# Patient Record
Sex: Female | Born: 1980 | Hispanic: No | Marital: Single | Smoking: Former smoker
Health system: Southern US, Community
[De-identification: ages and names within clinical notes are randomized; demographics above are authoritative.]

## PROBLEM LIST (undated history)

## (undated) DIAGNOSIS — F419 Anxiety disorder, unspecified: Secondary | ICD-10-CM

## (undated) DIAGNOSIS — F32A Depression, unspecified: Secondary | ICD-10-CM

## (undated) HISTORY — PX: DIAGNOSTIC LAPAROSCOPY WITH REMOVAL OF ECTOPIC PREGNANCY: SHX6449

## (undated) HISTORY — DX: Anxiety disorder, unspecified: F41.9

## (undated) HISTORY — DX: Depression, unspecified: F32.A

## (undated) HISTORY — PX: ROTATOR CUFF REPAIR: SHX139

---

## 2021-08-20 DIAGNOSIS — Z419 Encounter for procedure for purposes other than remedying health state, unspecified: Secondary | ICD-10-CM | POA: Diagnosis not present

## 2021-09-19 DIAGNOSIS — Z419 Encounter for procedure for purposes other than remedying health state, unspecified: Secondary | ICD-10-CM | POA: Diagnosis not present

## 2021-10-20 DIAGNOSIS — Z419 Encounter for procedure for purposes other than remedying health state, unspecified: Secondary | ICD-10-CM | POA: Diagnosis not present

## 2021-11-18 DIAGNOSIS — Z01419 Encounter for gynecological examination (general) (routine) without abnormal findings: Secondary | ICD-10-CM | POA: Diagnosis not present

## 2021-11-18 DIAGNOSIS — Z3046 Encounter for surveillance of implantable subdermal contraceptive: Secondary | ICD-10-CM | POA: Diagnosis not present

## 2021-11-18 DIAGNOSIS — E663 Overweight: Secondary | ICD-10-CM | POA: Diagnosis not present

## 2021-11-20 DIAGNOSIS — Z419 Encounter for procedure for purposes other than remedying health state, unspecified: Secondary | ICD-10-CM | POA: Diagnosis not present

## 2021-12-09 ENCOUNTER — Other Ambulatory Visit: Payer: Self-pay | Admitting: Obstetrics & Gynecology

## 2021-12-09 DIAGNOSIS — Z1231 Encounter for screening mammogram for malignant neoplasm of breast: Secondary | ICD-10-CM

## 2021-12-18 DIAGNOSIS — Z419 Encounter for procedure for purposes other than remedying health state, unspecified: Secondary | ICD-10-CM | POA: Diagnosis not present

## 2021-12-20 ENCOUNTER — Ambulatory Visit
Admission: RE | Admit: 2021-12-20 | Discharge: 2021-12-20 | Disposition: A | Discharge status: Home / Self Care | Payer: Medicaid Other | Source: Ambulatory Visit | Attending: Obstetrics & Gynecology | Admitting: Obstetrics & Gynecology

## 2021-12-20 DIAGNOSIS — Z1231 Encounter for screening mammogram for malignant neoplasm of breast: Secondary | ICD-10-CM | POA: Diagnosis not present

## 2022-01-18 DIAGNOSIS — Z419 Encounter for procedure for purposes other than remedying health state, unspecified: Secondary | ICD-10-CM | POA: Diagnosis not present

## 2022-02-17 DIAGNOSIS — Z419 Encounter for procedure for purposes other than remedying health state, unspecified: Secondary | ICD-10-CM | POA: Diagnosis not present

## 2022-02-19 ENCOUNTER — Ambulatory Visit: Payer: Self-pay | Admitting: Nurse Practitioner

## 2022-03-20 DIAGNOSIS — Z419 Encounter for procedure for purposes other than remedying health state, unspecified: Secondary | ICD-10-CM | POA: Diagnosis not present

## 2022-04-19 DIAGNOSIS — Z419 Encounter for procedure for purposes other than remedying health state, unspecified: Secondary | ICD-10-CM | POA: Diagnosis not present

## 2022-05-20 DIAGNOSIS — Z419 Encounter for procedure for purposes other than remedying health state, unspecified: Secondary | ICD-10-CM | POA: Diagnosis not present

## 2022-06-11 ENCOUNTER — Encounter: Payer: Self-pay | Admitting: Nurse Practitioner

## 2022-06-11 ENCOUNTER — Ambulatory Visit: Payer: Medicaid Other | Attending: Nurse Practitioner | Admitting: Nurse Practitioner

## 2022-06-11 VITALS — BP 125/78 | HR 68 | Temp 97.9°F | Ht 63.0 in | Wt 170.4 lb

## 2022-06-11 DIAGNOSIS — F419 Anxiety disorder, unspecified: Secondary | ICD-10-CM

## 2022-06-11 DIAGNOSIS — R590 Localized enlarged lymph nodes: Secondary | ICD-10-CM

## 2022-06-11 DIAGNOSIS — Z7689 Persons encountering health services in other specified circumstances: Secondary | ICD-10-CM | POA: Diagnosis not present

## 2022-06-11 DIAGNOSIS — F32A Depression, unspecified: Secondary | ICD-10-CM

## 2022-06-11 NOTE — Progress Notes (Deleted)
Lump on right side of neck and discuss referral to therapist.

## 2022-06-11 NOTE — Progress Notes (Signed)
Assessment & Plan:  Tami Howard was seen today for new patient (initial visit).  Diagnoses and all orders for this visit:  Encounter to establish care  Lymphadenopathy, cervical -     US Soft Tissue Head/Neck (NON-THYROID); Future  Anxiety and depression -     Ambulatory referral to Integrated Behavioral Health    Patient has been counseled on age-appropriate routine health concerns for screening and prevention. These are reviewed and up-to-date. Referrals have been placed accordingly. Immunizations are up-to-date or declined.    Subjective:   Chief Complaint  Patient presents with   New Patient (Initial Visit)        HPI Tami Howard 41 y.o. female presents to office today  To establish care. She moved to [place] about a year ago from [place]. No significant PMH.   Patient has been counseled on age-appropriate routine health concerns for screening and prevention. These are reviewed and up-to-date. Referrals have been placed accordingly. Immunizations are up-to-date or declined.     MAMMOGRAM: UTD 12-20-2021 PAP: UTD 12-20-2021  She has left posterior cervical lymphadenopathy. Non tender to touch. Has been present for several months now. She also had a dental procedure a few months ago but unsure if related.    Anxiety and Depression She was seeing a therapist in [place] for 3 months prior to moving to [place]. Would like to be referred to therapist to continue therapy. Interested in psychotherapy only at this time.      Review of Systems  Constitutional:  Negative for fever, malaise/fatigue and weight loss.  HENT: Negative.  Negative for nosebleeds.   Eyes: Negative.  Negative for blurred vision, double vision and photophobia.  Respiratory: Negative.  Negative for cough and shortness of breath.   Cardiovascular: Negative.  Negative for chest pain, palpitations and leg swelling.  Gastrointestinal: Negative.  Negative for heartburn, nausea and vomiting.  Musculoskeletal: Negative.  Negative  for myalgias.  Neurological: Negative.  Negative for dizziness, focal weakness, seizures and headaches.  Psychiatric/Behavioral: Negative.  Negative for suicidal ideas.     History reviewed. No pertinent past medical history.  History reviewed. No pertinent surgical history.  Family History  Problem Relation Age of Onset   Breast cancer Neg Hx     Social History Reviewed with no changes to be made today.   No outpatient medications prior to visit.   No facility-administered medications prior to visit.    No Known Allergies     Objective:    BP 125/78   Pulse 68   Temp 97.9 F (36.6 C) (Oral)   Ht 5\' 3"  (1.6 m)   Wt 147 lb (66.7 kg)   LMP 05/06/2022 (Exact Date)   SpO2 98%   BMI 26.04 kg/m  Wt Readings from Last 3 Encounters:  06/11/22 147 lb (66.7 kg)    Physical Exam Vitals and nursing note reviewed.  Constitutional:      Appearance: She is well-developed.  HENT:     Head: Normocephalic and atraumatic.  Cardiovascular:     Rate and Rhythm: Normal rate and regular rhythm.     Heart sounds: Normal heart sounds. No murmur heard.    No friction rub. No gallop.  Pulmonary:     Effort: Pulmonary effort is normal. No tachypnea or respiratory distress.     Breath sounds: Normal breath sounds. No decreased breath sounds, wheezing, rhonchi or rales.  Chest:     Chest wall: No tenderness.  Abdominal:     General: Bowel sounds are  normal.     Palpations: Abdomen is soft.  Musculoskeletal:        General: Normal range of motion.     Cervical back: Normal range of motion.  Skin:    General: Skin is warm and dry.  Neurological:     Mental Status: She is alert and oriented to person, place, and time.     Coordination: Coordination normal.  Psychiatric:        Behavior: Behavior normal. Behavior is cooperative.        Thought Content: Thought content normal.        Judgment: Judgment normal.          Patient has been counseled extensively about nutrition  and exercise as well as the importance of adherence with medications and regular follow-up. The patient was given clear instructions to go to ER or return to medical center if symptoms don't improve, worsen or new problems develop. The patient verbalized understanding.   Follow-up: Return for schedule physical in october or november.   Claiborne Rigg, FNP-BC [facility] [place], [place] [phone]   06/11/2022, 2:31 PM

## 2022-06-13 ENCOUNTER — Ambulatory Visit
Admission: RE | Admit: 2022-06-13 | Discharge: 2022-06-13 | Disposition: A | Discharge status: Home / Self Care | Payer: Medicaid Other | Source: Ambulatory Visit | Attending: Nurse Practitioner | Admitting: Nurse Practitioner

## 2022-06-13 DIAGNOSIS — R590 Localized enlarged lymph nodes: Secondary | ICD-10-CM

## 2022-06-13 DIAGNOSIS — R221 Localized swelling, mass and lump, neck: Secondary | ICD-10-CM | POA: Diagnosis not present

## 2022-06-20 DIAGNOSIS — Z419 Encounter for procedure for purposes other than remedying health state, unspecified: Secondary | ICD-10-CM | POA: Diagnosis not present

## 2022-07-20 DIAGNOSIS — Z419 Encounter for procedure for purposes other than remedying health state, unspecified: Secondary | ICD-10-CM | POA: Diagnosis not present

## 2022-08-20 DIAGNOSIS — Z419 Encounter for procedure for purposes other than remedying health state, unspecified: Secondary | ICD-10-CM | POA: Diagnosis not present

## 2022-08-25 ENCOUNTER — Ambulatory Visit: Payer: Medicaid Other | Admitting: Nurse Practitioner

## 2022-08-27 ENCOUNTER — Encounter: Payer: Self-pay | Admitting: Nurse Practitioner

## 2022-08-27 ENCOUNTER — Ambulatory Visit: Payer: Medicaid Other | Attending: Nurse Practitioner | Admitting: Nurse Practitioner

## 2022-08-27 VITALS — BP 111/72 | HR 90 | Temp 98.0°F | Ht 63.0 in | Wt 171.4 lb

## 2022-08-27 DIAGNOSIS — Z0000 Encounter for general adult medical examination without abnormal findings: Secondary | ICD-10-CM | POA: Diagnosis not present

## 2022-08-27 DIAGNOSIS — Z1159 Encounter for screening for other viral diseases: Secondary | ICD-10-CM | POA: Diagnosis not present

## 2022-08-27 NOTE — Progress Notes (Signed)
Assessment & Plan:  Tami Howard was seen today for annual exam.  Diagnoses and all orders for this visit:  Annual physical exam -     CMP14+EGFR -     CBC with Differential -     Lipid panel -     Thyroid Panel With TSH -     HCV Ab w Reflex to Quant PCR  Need for hepatitis C screening test -     HCV Ab w Reflex to Quant PCR    Patient has been counseled on age-appropriate routine health concerns for screening and prevention. These are reviewed and up-to-date. Referrals have been placed accordingly. Immunizations are up-to-date or declined.    Subjective:   Chief Complaint  Patient presents with   Annual Exam   HPI Tami Howard 41 y.o. female presents to office today for annual physical.  She has no questions or concerns today. Mother is not doing well. Recently diagnosed with cancer. She wants to know about hormone testing.     Review of Systems  Constitutional:  Negative for fever, malaise/fatigue and weight loss.  HENT: Negative.  Negative for nosebleeds.   Eyes: Negative.  Negative for blurred vision, double vision and photophobia.  Respiratory: Negative.  Negative for cough and shortness of breath.   Cardiovascular: Negative.  Negative for chest pain, palpitations and leg swelling.  Gastrointestinal: Negative.  Negative for heartburn, nausea and vomiting.  Genitourinary: Negative.   Musculoskeletal: Negative.  Negative for myalgias.  Skin: Negative.   Neurological: Negative.  Negative for dizziness, focal weakness, seizures and headaches.  Endo/Heme/Allergies: Negative.   Psychiatric/Behavioral: Negative.  Negative for suicidal ideas.     History reviewed. No pertinent past medical history.  History reviewed. No pertinent surgical history.  Family History  Problem Relation Age of Onset   Breast cancer Neg Hx     Social History Reviewed with no changes to be made today.   No outpatient medications prior to visit.   No facility-administered medications  prior to visit.    No Known Allergies     Objective:    BP 111/72   Pulse 90   Temp 98 F (36.7 C) (Temporal)   Ht _0  (1.6 m)   Wt 171 lb 6.4 oz (77.7 kg)   LMP 07/14/2022 (Approximate)   SpO2 99%   BMI 30.36 kg/m  Wt Readings from Last 3 Encounters:  08/27/22 171 lb 6.4 oz (77.7 kg)  06/11/22 170 lb 6.4 oz (77.3 kg)    Physical Exam Constitutional:      Appearance: She is well-developed.  HENT:     Head: Normocephalic and atraumatic.     Right Ear: Hearing, tympanic membrane, ear canal and external ear normal.     Left Ear: Hearing, tympanic membrane, ear canal and external ear normal.     Nose: Nose normal.     Right Turbinates: Not enlarged.     Left Turbinates: Not enlarged.     Mouth/Throat:     Lips: Pink.     Mouth: Mucous membranes are moist.     Dentition: No dental tenderness, gingival swelling, dental abscesses or gum lesions.     Pharynx: No oropharyngeal exudate.  Eyes:     General: No scleral icterus.       Right eye: No discharge.     Extraocular Movements: Extraocular movements intact.     Conjunctiva/sclera: Conjunctivae normal.     Pupils: Pupils are equal, round, and reactive to light.  Neck:  Thyroid: No thyromegaly.     Trachea: No tracheal deviation.  Cardiovascular:     Rate and Rhythm: Normal rate and regular rhythm.     Heart sounds: Normal heart sounds. No murmur heard.    No friction rub.  Pulmonary:     Effort: Pulmonary effort is normal. No accessory muscle usage or respiratory distress.     Breath sounds: Normal breath sounds. No decreased breath sounds, wheezing, rhonchi or rales.  Abdominal:     General: Bowel sounds are normal. There is no distension.     Palpations: Abdomen is soft. There is no mass.     Tenderness: There is no abdominal tenderness. There is no right CVA tenderness, left CVA tenderness, guarding or rebound.     Hernia: No hernia is present.  Musculoskeletal:        General: No tenderness or deformity.  Normal range of motion.     Cervical back: Normal range of motion and neck supple.  Lymphadenopathy:     Cervical: No cervical adenopathy.  Skin:    General: Skin is warm and dry.     Findings: No erythema.  Neurological:     Mental Status: She is alert and oriented to person, place, and time.     Cranial Nerves: No cranial nerve deficit.     Motor: Motor function is intact.     Coordination: Coordination is intact. Coordination normal.     Gait: Gait is intact.     Deep Tendon Reflexes:     Reflex Scores:      Patellar reflexes are 1+ on the right side and 1+ on the left side. Psychiatric:        Attention and Perception: Attention normal.        Mood and Affect: Mood normal.        Speech: Speech normal.        Behavior: Behavior normal.        Thought Content: Thought content normal.        Judgment: Judgment normal.          Patient has been counseled extensively about nutrition and exercise as well as the importance of adherence with medications and regular follow-up. The patient was given clear instructions to go to ER or return to medical center if symptoms don't improve, worsen or new problems develop. The patient verbalized understanding.   Follow-up: Return in about 6 months (around 02/25/2023).   Gildardo Pounds, FNP-BC [facility] and [place], [place]   08/27/2022, 3:28 PM

## 2022-08-28 LAB — CBC WITH DIFFERENTIAL/PLATELET
Basophils Absolute: 0.1 10*3/uL (ref 0.0–0.2)
Basos: 1 %
EOS (ABSOLUTE): 0.1 10*3/uL (ref 0.0–0.4)
Eos: 1 %
Hematocrit: 40.3 % (ref 34.0–46.6)
Hemoglobin: 13.6 g/dL (ref 11.1–15.9)
Immature Grans (Abs): 0 10*3/uL (ref 0.0–0.1)
Immature Granulocytes: 0 %
Lymphocytes Absolute: 3.5 10*3/uL — ABNORMAL HIGH (ref 0.7–3.1)
Lymphs: 40 %
MCH: 30.6 pg (ref 26.6–33.0)
MCHC: 33.7 g/dL (ref 31.5–35.7)
MCV: 91 fL (ref 79–97)
Monocytes Absolute: 0.7 10*3/uL (ref 0.1–0.9)
Monocytes: 8 %
Neutrophils Absolute: 4.4 10*3/uL (ref 1.4–7.0)
Neutrophils: 50 %
Platelets: 180 10*3/uL (ref 150–450)
RBC: 4.44 x10E6/uL (ref 3.77–5.28)
RDW: 12.8 % (ref 11.7–15.4)
WBC: 8.7 10*3/uL (ref 3.4–10.8)

## 2022-08-28 LAB — CMP14+EGFR
ALT: 14 IU/L (ref 0–32)
AST: 16 IU/L (ref 0–40)
Albumin/Globulin Ratio: 1.8 (ref 1.2–2.2)
Albumin: 4.5 g/dL (ref 3.9–4.9)
Alkaline Phosphatase: 48 IU/L (ref 44–121)
BUN/Creatinine Ratio: 8 — ABNORMAL LOW (ref 9–23)
BUN: 6 mg/dL (ref 6–24)
Bilirubin Total: 0.2 mg/dL (ref 0.0–1.2)
CO2: 24 mmol/L (ref 20–29)
Calcium: 10 mg/dL (ref 8.7–10.2)
Chloride: 103 mmol/L (ref 96–106)
Creatinine, Ser: 0.75 mg/dL (ref 0.57–1.00)
Globulin, Total: 2.5 g/dL (ref 1.5–4.5)
Glucose: 93 mg/dL (ref 70–99)
Potassium: 4.6 mmol/L (ref 3.5–5.2)
Sodium: 140 mmol/L (ref 134–144)
Total Protein: 7 g/dL (ref 6.0–8.5)
eGFR: 102 mL/min/{1.73_m2} (ref >59)

## 2022-08-28 LAB — LIPID PANEL
Chol/HDL Ratio: 2 ratio (ref 0.0–4.4)
Cholesterol, Total: 115 mg/dL (ref 100–199)
HDL: 58 mg/dL (ref >39)
LDL Chol Calc (NIH): 33 mg/dL (ref 0–99)
Triglycerides: 140 mg/dL (ref 0–149)
VLDL Cholesterol Cal: 24 mg/dL (ref 5–40)

## 2022-08-28 LAB — THYROID PANEL WITH TSH
Free Thyroxine Index: 1.6 (ref 1.2–4.9)
T3 Uptake Ratio: 19 % — ABNORMAL LOW (ref 24–39)
T4, Total: 8.4 ug/dL (ref 4.5–12.0)
TSH: 1.48 u[IU]/mL (ref 0.450–4.500)

## 2022-08-28 LAB — HCV AB W REFLEX TO QUANT PCR: HCV Ab: NONREACTIVE

## 2022-08-28 LAB — HCV INTERPRETATION

## 2022-09-19 DIAGNOSIS — Z419 Encounter for procedure for purposes other than remedying health state, unspecified: Secondary | ICD-10-CM | POA: Diagnosis not present

## 2022-10-20 DIAGNOSIS — Z419 Encounter for procedure for purposes other than remedying health state, unspecified: Secondary | ICD-10-CM | POA: Diagnosis not present

## 2022-11-20 DIAGNOSIS — Z419 Encounter for procedure for purposes other than remedying health state, unspecified: Secondary | ICD-10-CM | POA: Diagnosis not present

## 2022-12-19 DIAGNOSIS — Z419 Encounter for procedure for purposes other than remedying health state, unspecified: Secondary | ICD-10-CM | POA: Diagnosis not present

## 2022-12-25 DIAGNOSIS — F411 Generalized anxiety disorder: Secondary | ICD-10-CM | POA: Diagnosis not present

## 2022-12-25 DIAGNOSIS — F32A Depression, unspecified: Secondary | ICD-10-CM | POA: Diagnosis not present

## 2023-01-15 DIAGNOSIS — F411 Generalized anxiety disorder: Secondary | ICD-10-CM | POA: Diagnosis not present

## 2023-01-15 DIAGNOSIS — F4320 Adjustment disorder, unspecified: Secondary | ICD-10-CM | POA: Diagnosis not present

## 2023-01-19 DIAGNOSIS — Z419 Encounter for procedure for purposes other than remedying health state, unspecified: Secondary | ICD-10-CM | POA: Diagnosis not present

## 2023-01-22 DIAGNOSIS — F411 Generalized anxiety disorder: Secondary | ICD-10-CM | POA: Diagnosis not present

## 2023-01-22 DIAGNOSIS — F4320 Adjustment disorder, unspecified: Secondary | ICD-10-CM | POA: Diagnosis not present

## 2023-01-27 DIAGNOSIS — F4320 Adjustment disorder, unspecified: Secondary | ICD-10-CM | POA: Diagnosis not present

## 2023-01-27 DIAGNOSIS — F411 Generalized anxiety disorder: Secondary | ICD-10-CM | POA: Diagnosis not present

## 2023-02-06 ENCOUNTER — Encounter: Payer: Self-pay | Admitting: Obstetrics & Gynecology

## 2023-02-06 ENCOUNTER — Ambulatory Visit (INDEPENDENT_AMBULATORY_CARE_PROVIDER_SITE_OTHER): Payer: Medicaid Other | Admitting: Obstetrics & Gynecology

## 2023-02-06 ENCOUNTER — Other Ambulatory Visit (HOSPITAL_COMMUNITY)
Admission: RE | Admit: 2023-02-06 | Discharge: 2023-02-06 | Disposition: A | Discharge status: Home / Self Care | Payer: Medicaid Other | Source: Ambulatory Visit | Attending: Obstetrics & Gynecology | Admitting: Obstetrics & Gynecology

## 2023-02-06 ENCOUNTER — Other Ambulatory Visit: Payer: Self-pay

## 2023-02-06 VITALS — BP 108/79 | HR 79 | Wt 170.2 lb

## 2023-02-06 DIAGNOSIS — Z1231 Encounter for screening mammogram for malignant neoplasm of breast: Secondary | ICD-10-CM | POA: Diagnosis not present

## 2023-02-06 DIAGNOSIS — Z113 Encounter for screening for infections with a predominantly sexual mode of transmission: Secondary | ICD-10-CM

## 2023-02-06 DIAGNOSIS — Z01419 Encounter for gynecological examination (general) (routine) without abnormal findings: Secondary | ICD-10-CM

## 2023-02-06 NOTE — Progress Notes (Signed)
    GYNECOLOGY ANNUAL PREVENTATIVE CARE ENCOUNTER NOTE  History:     Tami Howard is a 42 y.o. F female here for a routine annual gynecologic exam.  Current complaints: none.  Desires annual STI testing and mammogram.   Denies abnormal vaginal bleeding, discharge, pelvic pain, problems with intercourse or other gynecologic concerns.    Gynecologic History Patient's last menstrual period was 01/13/2023 (approximate). Contraception: Unsure Last Pap: 01/09/2022. Result was normal with negative HPV Last Mammogram: 01/09/22.  Result was normal  Obstetric History OB History  No obstetric history on file.    History reviewed. No pertinent past medical history.  History reviewed. No pertinent surgical history.  No current outpatient medications on file prior to visit.   No current facility-administered medications on file prior to visit.    No Known Allergies  Social History:  reports that she has quit smoking. Her smoking use included cigarettes. She has never used smokeless tobacco. She reports current alcohol use. She reports current drug use. Drug: Marijuana.  Family History  Problem Relation Age of Onset   Breast cancer Neg Hx     The following portions of the patient's history were reviewed and updated as appropriate: allergies, current medications, past family history, past medical history, past social history, past surgical history and problem list.  Review of Systems Pertinent items noted in HPI and remainder of comprehensive ROS otherwise negative.  Physical Exam:  BP 108/79   Pulse 79   Wt 170 lb 3.2 oz (77.2 kg)   LMP 01/13/2023 (Approximate) Comment: last 3 days. super light  BMI 30.15 kg/m  CONSTITUTIONAL: Well-developed, well-nourished female in no acute distress.  HENT:  Normocephalic, atraumatic, External right and left ear normal.  EYES: Conjunctivae and EOM are normal. Pupils are equal, round, and reactive to light. No scleral icterus.  NECK: Normal  range of motion, supple, no masses.  Normal thyroid.  SKIN: Skin is warm and dry. No rash noted. Not diaphoretic. No erythema. No pallor. MUSCULOSKELETAL: Normal range of motion. No tenderness.  No cyanosis, clubbing, or edema. NEUROLOGIC: Alert and oriented to person, place, and time. Normal reflexes, muscle tone coordination.  PSYCHIATRIC: Normal mood and affect. Normal behavior. Normal judgment and thought content. CARDIOVASCULAR: Normal heart rate noted, regular rhythm RESPIRATORY: Clear to auscultation bilaterally. Effort and breath sounds normal, no problems with respiration noted. BREASTS: Symmetric in size. No masses, tenderness, skin changes, nipple drainage, or lymphadenopathy bilaterally. Performed in the presence of a chaperone. ABDOMEN: Soft, no distention noted.  No tenderness, rebound or guarding.  PELVIC: Deferred   Assessment and Plan:     1. Breast cancer screening by mammogram Mammogram scheduled - MM 3D SCREENING MAMMOGRAM BILATERAL BREAST; Future  2. Routine screening for STI (sexually transmitted infection) STI screening done, will follow up results and manage accordingly. - Cervicovaginal ancillary only - Hepatitis B surface antigen - RPR - HIV Antibody (routine testing w rflx)  3. Well woman exam with routine gynecological exam Up to date on  pap smear. Patient did have concerns about possible low libido but reports a lot is going on in her life.  Discussed options to help and told to research Addyi. Will follow up later if she desires. Routine preventative health maintenance measures emphasized. Please refer to After Visit Summary for other counseling recommendations.      Jaynie Collins, MD, FACOG Obstetrician & Gynecologist, [facility] for [employer], [facility]

## 2023-02-06 NOTE — Patient Instructions (Signed)
ADDYI - research this for low sexual desire

## 2023-02-07 LAB — HEPATITIS B SURFACE ANTIGEN: Hepatitis B Surface Ag: NEGATIVE

## 2023-02-07 LAB — RPR: RPR Ser Ql: NONREACTIVE

## 2023-02-07 LAB — HIV ANTIBODY (ROUTINE TESTING W REFLEX): HIV Screen 4th Generation wRfx: NONREACTIVE

## 2023-02-09 LAB — CERVICOVAGINAL ANCILLARY ONLY
Chlamydia: NEGATIVE
Comment: NEGATIVE
Comment: NEGATIVE
Comment: NORMAL
Neisseria Gonorrhea: NEGATIVE
Trichomonas: NEGATIVE

## 2023-02-17 DIAGNOSIS — F4320 Adjustment disorder, unspecified: Secondary | ICD-10-CM | POA: Diagnosis not present

## 2023-02-17 DIAGNOSIS — F411 Generalized anxiety disorder: Secondary | ICD-10-CM | POA: Diagnosis not present

## 2023-02-18 DIAGNOSIS — Z419 Encounter for procedure for purposes other than remedying health state, unspecified: Secondary | ICD-10-CM | POA: Diagnosis not present

## 2023-02-25 ENCOUNTER — Ambulatory Visit: Payer: Medicaid Other | Attending: Nurse Practitioner | Admitting: Nurse Practitioner

## 2023-02-25 ENCOUNTER — Encounter: Payer: Self-pay | Admitting: Nurse Practitioner

## 2023-02-25 VITALS — BP 110/76 | HR 78 | Ht 63.0 in | Wt 170.4 lb

## 2023-02-25 DIAGNOSIS — F32A Depression, unspecified: Secondary | ICD-10-CM

## 2023-02-25 DIAGNOSIS — F419 Anxiety disorder, unspecified: Secondary | ICD-10-CM | POA: Diagnosis not present

## 2023-02-25 DIAGNOSIS — F439 Reaction to severe stress, unspecified: Secondary | ICD-10-CM

## 2023-02-25 DIAGNOSIS — R590 Localized enlarged lymph nodes: Secondary | ICD-10-CM | POA: Diagnosis not present

## 2023-02-25 DIAGNOSIS — Z23 Encounter for immunization: Secondary | ICD-10-CM

## 2023-02-25 DIAGNOSIS — R7989 Other specified abnormal findings of blood chemistry: Secondary | ICD-10-CM

## 2023-02-25 NOTE — Progress Notes (Signed)
Assessment & Plan:  Tami Howard was seen today for back pain.  Diagnoses and all orders for this visit:  Lymphadenopathy, cervical Resolved  Stress Denies any thoughts of self harm.  T3 low in serum -     Thyroid Panel With TSH  Need for Tdap vaccination -     Tdap vaccine greater than or equal to 43yo IM    Patient has been counseled on age-appropriate routine health concerns for screening and prevention. These are reviewed and up-to-date. Referrals have been placed accordingly. Immunizations are up-to-date or declined.    Subjective:   Chief Complaint  Patient presents with   Adenopathy   HPI Tami Howard 42 y.o. female presents to office today for follow up to cervical lymphadenopathy.   Patient has been counseled on age-appropriate routine health concerns for screening and prevention. These are reviewed and up-to-date. Referrals have been placed accordingly. Immunizations are up-to-date or declined.     MAMMOGRAM: OVERDUE; scheduled   PAP: UTD 12-20-2021  She had an enlarged left posterior cervical lymph node on her visit 06-11-2022. Non tender to touch. Had been present for several months prior to that visit. She also had a dental procedure a few months prior but unsure if related. Today she reports the lymph node is now a normal size. [place] ordered at the time of concern was also negative.   Anxiety and Depression She was seeing a therapist in [place] for 3 months prior to moving to [place]. Would like to be referred to therapist to continue therapy. Interested in psychotherapy only at this time.   Review of Systems  Constitutional:  Negative for fever, malaise/fatigue and weight loss.  HENT: Negative.  Negative for nosebleeds.   Eyes: Negative.  Negative for blurred vision, double vision and photophobia.  Respiratory: Negative.  Negative for cough and shortness of breath.   Cardiovascular: Negative.  Negative for chest pain, palpitations and leg swelling.  Gastrointestinal:  Negative.  Negative for heartburn, nausea and vomiting.  Musculoskeletal: Negative.  Negative for myalgias.  Neurological: Negative.  Negative for dizziness, focal weakness, seizures and headaches.  Psychiatric/Behavioral:  Negative for suicidal ideas. The patient has insomnia.     No past medical history on file.  No past surgical history on file.  Family History  Problem Relation Age of Onset   Breast cancer Neg Hx     Social History Reviewed with no changes to be made today.   No outpatient medications prior to visit.   No facility-administered medications prior to visit.    No Known Allergies     Objective:    BP 110/76 (BP Location: Left Arm, Patient Position: Sitting, Cuff Size: Normal)   Pulse 78   Ht 5\' 3"  (1.6 m)   Wt 170 lb 6.4 oz (77.3 kg)   LMP 02/25/2023 (Exact Date) Comment: super light  SpO2 98%   BMI 30.19 kg/m  Wt Readings from Last 3 Encounters:  02/25/23 170 lb 6.4 oz (77.3 kg)  02/06/23 170 lb 3.2 oz (77.2 kg)  08/27/22 171 lb 6.4 oz (77.7 kg)    Physical Exam Vitals and nursing note reviewed.  Constitutional:      Appearance: She is well-developed.  HENT:     Head: Normocephalic and atraumatic.  Cardiovascular:     Rate and Rhythm: Normal rate and regular rhythm.     Heart sounds: Normal heart sounds. No murmur heard.    No friction rub. No gallop.  Pulmonary:     Effort: Pulmonary  effort is normal. No tachypnea or respiratory distress.     Breath sounds: Normal breath sounds. No decreased breath sounds, wheezing, rhonchi or rales.  Chest:     Chest wall: No tenderness.  Abdominal:     General: Bowel sounds are normal.     Palpations: Abdomen is soft.  Musculoskeletal:        General: Normal range of motion.     Cervical back: Normal range of motion.  Skin:    General: Skin is warm and dry.  Neurological:     Mental Status: She is alert and oriented to person, place, and time.     Coordination: Coordination normal.  Psychiatric:         Behavior: Behavior normal. Behavior is cooperative.        Thought Content: Thought content normal.        Judgment: Judgment normal.          Patient has been counseled extensively about nutrition and exercise as well as the importance of adherence with medications and regular follow-up. The patient was given clear instructions to go to ER or return to medical center if symptoms don't improve, worsen or new problems develop. The patient verbalized understanding.   Follow-up: Return in about 6 months (around 08/28/2023) for physical.   Claiborne Rigg, FNP-BC [facility] and [facility] [place], [place] [phone]   02/25/2023, 4:52 PM

## 2023-02-26 ENCOUNTER — Encounter: Payer: Self-pay | Admitting: Nurse Practitioner

## 2023-02-26 LAB — THYROID PANEL WITH TSH
Free Thyroxine Index: 1.8 (ref 1.2–4.9)
T3 Uptake Ratio: 19 % — ABNORMAL LOW (ref 24–39)
T4, Total: 9.6 ug/dL (ref 4.5–12.0)
TSH: 1.59 u[IU]/mL (ref 0.450–4.500)

## 2023-03-03 DIAGNOSIS — F32A Depression, unspecified: Secondary | ICD-10-CM | POA: Diagnosis not present

## 2023-03-03 DIAGNOSIS — F411 Generalized anxiety disorder: Secondary | ICD-10-CM | POA: Diagnosis not present

## 2023-03-17 ENCOUNTER — Ambulatory Visit: Payer: Medicaid Other

## 2023-03-18 DIAGNOSIS — F411 Generalized anxiety disorder: Secondary | ICD-10-CM | POA: Diagnosis not present

## 2023-03-18 DIAGNOSIS — F320 Major depressive disorder, single episode, mild: Secondary | ICD-10-CM | POA: Diagnosis not present

## 2023-03-19 ENCOUNTER — Ambulatory Visit
Admission: RE | Admit: 2023-03-19 | Discharge: 2023-03-19 | Disposition: A | Discharge status: Home / Self Care | Payer: Medicaid Other | Source: Ambulatory Visit | Attending: Obstetrics & Gynecology | Admitting: Obstetrics & Gynecology

## 2023-03-19 DIAGNOSIS — Z1231 Encounter for screening mammogram for malignant neoplasm of breast: Secondary | ICD-10-CM

## 2023-03-20 ENCOUNTER — Other Ambulatory Visit: Payer: Self-pay | Admitting: Obstetrics & Gynecology

## 2023-03-20 DIAGNOSIS — R928 Other abnormal and inconclusive findings on diagnostic imaging of breast: Secondary | ICD-10-CM

## 2023-03-21 DIAGNOSIS — Z419 Encounter for procedure for purposes other than remedying health state, unspecified: Secondary | ICD-10-CM | POA: Diagnosis not present

## 2023-04-08 ENCOUNTER — Ambulatory Visit
Admission: RE | Admit: 2023-04-08 | Discharge: 2023-04-08 | Disposition: A | Discharge status: Home / Self Care | Payer: Medicaid Other | Source: Ambulatory Visit | Attending: Obstetrics & Gynecology | Admitting: Obstetrics & Gynecology

## 2023-04-08 DIAGNOSIS — R921 Mammographic calcification found on diagnostic imaging of breast: Secondary | ICD-10-CM | POA: Diagnosis not present

## 2023-04-08 DIAGNOSIS — R928 Other abnormal and inconclusive findings on diagnostic imaging of breast: Secondary | ICD-10-CM

## 2023-04-09 ENCOUNTER — Other Ambulatory Visit: Payer: Self-pay | Admitting: Obstetrics & Gynecology

## 2023-04-09 DIAGNOSIS — R921 Mammographic calcification found on diagnostic imaging of breast: Secondary | ICD-10-CM

## 2023-04-09 DIAGNOSIS — F411 Generalized anxiety disorder: Secondary | ICD-10-CM | POA: Diagnosis not present

## 2023-04-20 DIAGNOSIS — F32A Depression, unspecified: Secondary | ICD-10-CM | POA: Diagnosis not present

## 2023-04-20 DIAGNOSIS — Z419 Encounter for procedure for purposes other than remedying health state, unspecified: Secondary | ICD-10-CM | POA: Diagnosis not present

## 2023-04-20 DIAGNOSIS — F411 Generalized anxiety disorder: Secondary | ICD-10-CM | POA: Diagnosis not present

## 2023-04-27 ENCOUNTER — Ambulatory Visit
Admission: RE | Admit: 2023-04-27 | Discharge: 2023-04-27 | Disposition: A | Discharge status: Home / Self Care | Payer: Medicaid Other | Source: Ambulatory Visit | Attending: Obstetrics & Gynecology | Admitting: Obstetrics & Gynecology

## 2023-04-27 DIAGNOSIS — R921 Mammographic calcification found on diagnostic imaging of breast: Secondary | ICD-10-CM

## 2023-04-27 HISTORY — PX: BREAST BIOPSY: SHX20

## 2023-04-28 DIAGNOSIS — F320 Major depressive disorder, single episode, mild: Secondary | ICD-10-CM | POA: Diagnosis not present

## 2023-04-28 DIAGNOSIS — F411 Generalized anxiety disorder: Secondary | ICD-10-CM | POA: Diagnosis not present

## 2023-04-29 ENCOUNTER — Telehealth: Payer: Self-pay | Admitting: Hematology and Oncology

## 2023-04-29 NOTE — Telephone Encounter (Signed)
Spoke to patient to confirm upcoming morning [facility] clinic appointment on 7/17, paperwork will be sent via mail.   Gave location and time, also informed patient that the surgeon's office would be calling as well to get information from them similar to the packet that they will be receiving so make sure to do both.  Reminded patient that all providers will be coming to the clinic to see them HERE and if they had any questions to not hesitate to reach back out to myself or their navigators.

## 2023-04-30 ENCOUNTER — Encounter: Payer: Self-pay | Admitting: Nurse Practitioner

## 2023-04-30 NOTE — Progress Notes (Signed)
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  She is doing okay and has scheduled follow up next week to discuss management.  Support was given to her, also commended her on following up with recommended studies after her initial abnormal mammogram. Also commended her for going forward with getting the biopsy that revealed this result.  She was told that my office and I will be available for support at any time, and we are always here for any gynecologic concerns.   Jaynie Collins, MD, FACOG Obstetrician & Gynecologist, [facility] for [employer], [facility]

## 2023-05-04 ENCOUNTER — Encounter: Payer: Self-pay | Admitting: *Deleted

## 2023-05-04 DIAGNOSIS — D0512 Intraductal carcinoma in situ of left breast: Secondary | ICD-10-CM | POA: Insufficient documentation

## 2023-05-06 ENCOUNTER — Ambulatory Visit
Admission: RE | Admit: 2023-05-06 | Discharge: 2023-05-06 | Disposition: A | Discharge status: Home / Self Care | Payer: Medicaid Other | Source: Ambulatory Visit | Attending: Radiation Oncology | Admitting: Radiation Oncology

## 2023-05-06 ENCOUNTER — Encounter: Payer: Self-pay | Admitting: *Deleted

## 2023-05-06 ENCOUNTER — Encounter: Payer: Self-pay | Admitting: Radiation Oncology

## 2023-05-06 ENCOUNTER — Inpatient Hospital Stay: Payer: Medicaid Other | Attending: Hematology and Oncology | Admitting: Hematology and Oncology

## 2023-05-06 ENCOUNTER — Other Ambulatory Visit: Payer: Self-pay | Admitting: *Deleted

## 2023-05-06 ENCOUNTER — Encounter: Payer: Self-pay | Admitting: Genetic Counselor

## 2023-05-06 ENCOUNTER — Inpatient Hospital Stay: Payer: Medicaid Other

## 2023-05-06 ENCOUNTER — Ambulatory Visit: Payer: Medicaid Other | Admitting: Physical Therapy

## 2023-05-06 ENCOUNTER — Inpatient Hospital Stay (HOSPITAL_BASED_OUTPATIENT_CLINIC_OR_DEPARTMENT_OTHER): Payer: Medicaid Other | Admitting: Genetic Counselor

## 2023-05-06 ENCOUNTER — Other Ambulatory Visit: Payer: Self-pay

## 2023-05-06 VITALS — BP 121/82 | HR 83 | Temp 98.1°F | Resp 18 | Ht 63.0 in | Wt 172.6 lb

## 2023-05-06 DIAGNOSIS — D0512 Intraductal carcinoma in situ of left breast: Secondary | ICD-10-CM

## 2023-05-06 DIAGNOSIS — Z170 Estrogen receptor positive status [ER+]: Secondary | ICD-10-CM | POA: Diagnosis not present

## 2023-05-06 DIAGNOSIS — Z8051 Family history of malignant neoplasm of kidney: Secondary | ICD-10-CM

## 2023-05-06 DIAGNOSIS — Z803 Family history of malignant neoplasm of breast: Secondary | ICD-10-CM | POA: Diagnosis not present

## 2023-05-06 DIAGNOSIS — Z87891 Personal history of nicotine dependence: Secondary | ICD-10-CM | POA: Insufficient documentation

## 2023-05-06 DIAGNOSIS — Z853 Personal history of malignant neoplasm of breast: Secondary | ICD-10-CM | POA: Diagnosis not present

## 2023-05-06 DIAGNOSIS — Z800 Family history of malignant neoplasm of digestive organs: Secondary | ICD-10-CM | POA: Diagnosis not present

## 2023-05-06 LAB — CBC WITH DIFFERENTIAL (CANCER CENTER ONLY)
Abs Immature Granulocytes: 0.02 10*3/uL (ref 0.00–0.07)
Basophils Absolute: 0.1 10*3/uL (ref 0.0–0.1)
Basophils Relative: 1 %
Eosinophils Absolute: 0.2 10*3/uL (ref 0.0–0.5)
Eosinophils Relative: 2 %
HCT: 38.5 % (ref 36.0–46.0)
Hemoglobin: 13 g/dL (ref 12.0–15.0)
Immature Granulocytes: 0 %
Lymphocytes Relative: 40 %
Lymphs Abs: 3.2 10*3/uL (ref 0.7–4.0)
MCH: 30.4 pg (ref 26.0–34.0)
MCHC: 33.8 g/dL (ref 30.0–36.0)
MCV: 90 fL (ref 80.0–100.0)
Monocytes Absolute: 0.6 10*3/uL (ref 0.1–1.0)
Monocytes Relative: 7 %
Neutro Abs: 4 10*3/uL (ref 1.7–7.7)
Neutrophils Relative %: 50 %
Platelet Count: 249 10*3/uL (ref 150–400)
RBC: 4.28 MIL/uL (ref 3.87–5.11)
RDW: 12.1 % (ref 11.5–15.5)
WBC Count: 8 10*3/uL (ref 4.0–10.5)
nRBC: 0 % (ref 0.0–0.2)

## 2023-05-06 LAB — CMP (CANCER CENTER ONLY)
ALT: 14 U/L (ref 0–44)
AST: 14 U/L — ABNORMAL LOW (ref 15–41)
Albumin: 4.1 g/dL (ref 3.5–5.0)
Alkaline Phosphatase: 51 U/L (ref 38–126)
Anion gap: 8 (ref 5–15)
BUN: 8 mg/dL (ref 6–20)
CO2: 23 mmol/L (ref 22–32)
Calcium: 8.9 mg/dL (ref 8.9–10.3)
Chloride: 108 mmol/L (ref 98–111)
Creatinine: 0.8 mg/dL (ref 0.44–1.00)
GFR, Estimated: >60 mL/min (ref >60)
Glucose, Bld: 102 mg/dL — ABNORMAL HIGH (ref 70–99)
Potassium: 3.7 mmol/L (ref 3.5–5.1)
Sodium: 139 mmol/L (ref 135–145)
Total Bilirubin: 0.4 mg/dL (ref 0.3–1.2)
Total Protein: 6.8 g/dL (ref 6.5–8.1)

## 2023-05-06 LAB — GENETIC SCREENING ORDER

## 2023-05-06 NOTE — Progress Notes (Signed)
REFERRING PROVIDER: [name], MD  PRIMARY PROVIDER:  Claiborne Rigg, NP  PRIMARY REASON FOR VISIT:  1. Ductal carcinoma in situ (DCIS) of left breast   2. Family history of breast cancer   3. Family history of kidney cancer    HISTORY OF PRESENT ILLNESS:   Tami Howard, a 42 y.o. female, was seen for a  cancer genetics consultation during the breast multidisciplinary clinic at the request of Dr. Al Pimple due to a personal and family history of cancer.  Tami Howard presents to clinic today to discuss the possibility of a hereditary predisposition to cancer, to discuss genetic testing, and to further clarify her future cancer risks, as well as potential cancer risks for family members.   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   CANCER HISTORY:  Oncology History  Ductal carcinoma in situ (DCIS) of left breast  03/19/2023 Mammogram   Patient had bilateral screening mammogram which showed possible calcifications in the left breast.  Left breast diagnostic mammogram showed extremely dense breasts and recommendation was to consider repeating the mammogram in 6 months versus considering biopsy.  She initially wanted to wait but after discussing with her family decided to pursue biopsy.   04/27/2023 Pathology Results   Left breast needle core biopsy showed DCIS, predominantly cribriform type with focal necrosis and associated calcifications prognostic showed ER 99% positive moderate to strong staining PR 100% positive strong staining   05/04/2023 Initial Diagnosis   Ductal carcinoma in situ (DCIS) of left breast     No past medical history on file.  Past Surgical History:  Procedure Laterality Date   BREAST BIOPSY Left 04/27/2023   MM LT BREAST BX W LOC DEV 1ST LESION IMAGE BX SPEC STEREO GUIDE 04/27/2023 [facility] MAMMOGR[facility]   DIAGNOSTIC LAPAROSCOPY WITH REMOVAL OF ECTOPIC PREGNANCY Left     pt believes on left side, was able to successfully have children after procedure   ROTATOR CUFF REPAIR      Social History   Socioeconomic History   Marital status: Single    Spouse name: Not on file   Number of children: Not on file   Years of education: Not on file   Highest education level: Not on file  Occupational History   Not on file  Tobacco Use   Smoking status: Former    Current packs/day: 0.00    Types: Cigarettes    Start date: 10/1996    Quit date: 10/2022    Years since quitting: 0.5   Smokeless tobacco: Never  Vaping Use   Vaping status: Every Day   Substances: Nicotine, Flavoring  Substance and Sexual Activity   Alcohol use: Yes    Comment: occasional   Drug use: Not Currently    Types: Marijuana   Sexual activity: Yes    Birth control/protection: Implant    Comment: nexpalon  Other Topics Concern   Not on file  Social History Narrative   Not on file   Social Determinants of Health   Financial Resource Strain: Not on file  Food Insecurity: Not on file  Transportation Needs: Not on file  Physical Activity: Not on file  Stress: Not on file  Social Connections: Not on file     FAMILY HISTORY:  We obtained a detailed, 4-generation family history.  Significant diagnoses are listed below: Family History  Problem Relation Age of Onset   Kidney cancer Mother  55   Liver cancer Maternal Grandmother    Liver cancer Maternal Grandfather    Breast cancer Paternal Grandmother 86   Breast cancer Other      Tami Howard's mother was diagnosed with kidney cancer at age 82. Her maternal grandmother and maternal grandfather were diagnosed with liver cancer, they are both deceased. Tami Howard's paternal grandmother was diagnosed with breast cancer at age 14. Tami Howard is unaware of previous family history of genetic testing for hereditary cancer risks. There is no reported Ashkenazi Jewish ancestry.   GENETIC COUNSELING ASSESSMENT: Tami Howard is a 42  y.o. female with a personal and family history of cancer which is somewhat suggestive of a hereditary cancer syndrome and predisposition to cancer give her young age at diagnosis. We, therefore, discussed and recommended the following at today's visit.   DISCUSSION: We discussed that 5 - 10% of cancer is hereditary, with most cases of hereditary breast cancer associated with mutations in BRCA1/2.  There are other genes that can be associated with hereditary breast cancer syndromes. Type of cancer risk and level of risk are gene-specific. We discussed that testing is beneficial for several reasons including knowing how to follow individuals after completing their treatment, identifying whether potential treatment options would be beneficial, and understanding if other family members could be at risk for cancer and allowing them to undergo genetic testing.   We reviewed the characteristics, features and inheritance patterns of hereditary cancer syndromes. We also discussed genetic testing, including the appropriate family members to test, the process of testing, insurance coverage and turn-around-time for results. We discussed the implications of a negative, positive and/or variant of uncertain significant result. In order to get genetic test results in a timely manner so that Tami Howard can use these genetic test results for surgical decisions, we recommended Tami Howard pursue genetic testing for the Breast Cancer STAT Panel. Once complete, we recommend Tami Howard pursue reflex genetic testing to a more comprehensive gene panel.   Tami Howard  was offered a common hereditary cancer panel (48 genes) and an expanded pan-cancer panel (70 genes). Tami Howard was informed of the benefits and limitations of each panel, including that expanded pan-cancer panels contain genes that do not have clear management guidelines at this point in time.  We also discussed that as the number of genes included on a panel  increases, the chances of variants of uncertain significance increases.  After considering the benefits and limitations of each gene panel, Tami Howard elected to have Multi-Cancer Panel.  The Multi-Cancer + RNA Panel offered by [facility] includes sequencing and/or deletion/duplication analysis of the following 70 genes:  AIP*, ALK, APC*, ATM*, AXIN2*, BAP1*, BARD1*, BLM*, BMPR1A*, BRCA1*, BRCA2*, BRIP1*, CDC73*, CDH1*, CDK4, CDKN1B*, CDKN2A, CHEK2*, CTNNA1*, DICER1*, EPCAM (del/dup only), EGFR, FH*, FLCN*, GREM1 (promoter dup only), HOXB13, KIT, LZTR1, MAX*, MBD4, MEN1*, MET, MITF, MLH1*, MSH2*, MSH3*, MSH6*, MUTYH*, NF1*, NF2*, NTHL1*, PALB2*, PDGFRA, PMS2*, POLD1*, POLE*, POT1*, PRKAR1A*, PTCH1*, PTEN*, RAD51C*, RAD51D*, RB1*, RET, SDHA* (sequencing only), SDHAF2*, SDHB*, SDHC*, SDHD*, SMAD4*, SMARCA4*, SMARCB1*, SMARCE1*, STK11*, SUFU*, TMEM127*, TP53*, TSC1*, TSC2*, VHL*. RNA analysis is performed for * genes.  Based on Tami Howard's personal and family history of cancer, she meets medical criteria for genetic testing. Despite that she meets criteria, she may still have an out of pocket cost. We discussed that if her out of pocket cost for testing is over $100, the laboratory should contact them to discuss self-pay prices, patient pay assistance programs, if applicable, and  other billing options.   PLAN: After considering the risks, benefits, and limitations, Tami Howard provided informed consent to pursue genetic testing and the blood sample was sent to [facility] for analysis of the Multi-Cancer Panel. Results should be available within approximately 1-2 weeks' time, at which point they will be disclosed by telephone to Tami Howard, as will any additional recommendations warranted by these results. Ms. Iott will receive a summary of her genetic counseling visit and a copy of her results once available. This information will also be available in Epic.   Ms. Skousen's questions were  answered to her satisfaction today. Our contact information was provided should additional questions or concerns arise. Thank you for the referral and allowing [place] to share in the care of your patient.   Lalla Brothers, MS, [facility] Genetic Counselor [email or web address] ([phone]  The patient was seen for a total of 20 minutes in face-to-face genetic counseling.  The patient brought her husband. Drs. Pamelia Hoit and/or Mosetta Putt were available to discuss this case as needed.  _______________________________________________________________________ For Office Staff:  Number of people involved in session: 2 Was an Intern/ student involved with case: no

## 2023-05-06 NOTE — Progress Notes (Signed)
[facility] CONSULT NOTE  Patient Care Team: Claiborne Rigg, NP as PCP - General (Nurse Practitioner) Pershing Proud, RN as Oncology Nurse Navigator Donnelly Angelica, RN as Oncology Nurse Navigator [name], MD as Consulting Physician (Hematology and Oncology) [name], MD as Consulting Physician (General Surgery) Dorothy Puffer, MD as Consulting Physician (Radiation Oncology)  CHIEF COMPLAINTS/PURPOSE OF CONSULTATION:  Newly diagnosed breast cancer  HISTORY OF PRESENTING ILLNESS:  Tami Howard 42 y.o. female is here because of recent diagnosis of left breast DCIS  I reviewed her records extensively and collaborated the history with the patient.  SUMMARY OF ONCOLOGIC HISTORY: Oncology History  Ductal carcinoma in situ (DCIS) of left breast  03/19/2023 Mammogram   Patient had bilateral screening mammogram which showed possible calcifications in the left breast.  Left breast diagnostic mammogram showed extremely dense breasts and recommendation was to consider repeating the mammogram in 6 months versus considering biopsy.  She initially wanted to wait but after discussing with her family decided to pursue biopsy.   04/27/2023 Pathology Results   Left breast needle core biopsy showed DCIS, predominantly cribriform type with focal necrosis and associated calcifications prognostic showed ER 99% positive moderate to strong staining PR 100% positive strong staining   05/04/2023 Initial Diagnosis   Ductal carcinoma in situ (DCIS) of left breast   Patient arrived to the appointment today with Mr. Montel Culver.  Her mother and son were present on the video call.  She is healthy at baseline and denies any major medical comorbidities.  She just takes routine vitamin daily, no major medical history or surgical history.  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  Rest of the pertinent 10 point ROS reviewed and negative morning   MEDICAL HISTORY:  No past  medical history on file.  SURGICAL HISTORY: Past Surgical History:  Procedure Laterality Date   BREAST BIOPSY Left 04/27/2023   MM LT BREAST BX W LOC DEV 1ST LESION IMAGE BX SPEC STEREO GUIDE 04/27/2023 [facility] MAMMOGR[facility]   DIAGNOSTIC LAPAROSCOPY WITH REMOVAL OF ECTOPIC PREGNANCY Left    pt believes on left side, was able to successfully have children after procedure   ROTATOR CUFF REPAIR      SOCIAL HISTORY: Social History   Socioeconomic History   Marital status: Single    Spouse name: Not on file   Number of children: Not on file   Years of education: Not on file   Highest education level: Not on file  Occupational History   Not on file  Tobacco Use   Smoking status: Former    Current packs/day: 0.00    Types: Cigarettes    Start date: 10/1996    Quit date: 10/2022    Years since quitting: 0.5   Smokeless tobacco: Never  Vaping Use   Vaping status: Every Day   Substances: Nicotine, Flavoring  Substance and Sexual Activity   Alcohol use: Yes    Comment: occasional   Drug use: Not Currently    Types: Marijuana   Sexual activity: Yes    Birth control/protection: Implant    Comment: nexpalon  Other Topics Concern   Not on file  Social History Narrative   Not on file   Social Determinants of Health   Financial Resource Strain: Not on file  Food Insecurity: Not on file  Transportation Needs: Not on file  Physical Activity: Not on file  Stress: Not on file  Social Connections: Not on file  Intimate  Partner Violence: Not on file    FAMILY HISTORY: Family History  Problem Relation Age of Onset   Breast cancer Other     ALLERGIES:  has No Known Allergies.  MEDICATIONS:  Current Outpatient Medications  Medication Sig Dispense Refill   Multiple Vitamins-Minerals (MULTIPLE VITAMINS/WOMENS PO) Take by mouth.     No current facility-administered medications for this visit.    REVIEW OF SYSTEMS:   Constitutional: Denies fevers, chills or abnormal night  sweats Eyes: Denies blurriness of vision, double vision or watery eyes Ears, nose, mouth, throat, and face: Denies mucositis or sore throat Respiratory: Denies cough, dyspnea or wheezes Cardiovascular: Denies palpitation, chest discomfort or lower extremity swelling Gastrointestinal:  Denies nausea, heartburn or change in bowel habits Skin: Denies abnormal skin rashes Lymphatics: Denies new lymphadenopathy or easy bruising Neurological:Denies numbness, tingling or new weaknesses Behavioral/Psych: Mood is stable, no new changes  Breast: Denies any palpable lumps or discharge All other systems were reviewed with the patient and are negative.  PHYSICAL EXAMINATION: ECOG PERFORMANCE STATUS: 0 - Asymptomatic  Vitals:   05/06/23 0911  BP: 121/82  Pulse: 83  Resp: 18  Temp: 98.1 F (36.7 C)  SpO2: 100%   Filed Weights   05/06/23 0911  Weight: 172 lb 9.6 oz (78.3 kg)    GENERAL:alert, no distress and comfortable SKIN: skin color, texture, turgor are normal, no rashes or significant lesions EYES: normal, conjunctiva are pink and non-injected, sclera clear OROPHARYNX:no exudate, no erythema and lips, buccal mucosa, and tongue normal  NECK: supple, thyroid normal size, non-tender, without nodularity LYMPH:  no palpable lymphadenopathy in the cervical, axillary  LUNGS: clear to auscultation and percussion with normal breathing effort HEART: regular rate & rhythm and no murmurs and no lower extremity edema ABDOMEN:abdomen soft, non-tender and normal bowel sounds Musculoskeletal:no cyanosis of digits and no clubbing  PSYCH: alert & oriented x 3 with fluent speech NEURO: no focal motor/sensory deficits BREAST: No palpable breast changes noted on exam today.  LABORATORY DATA:  I have reviewed the data as listed Lab Results  Component Value Date   WBC 8.0 05/06/2023   HGB 13.0 05/06/2023   HCT 38.5 05/06/2023   MCV 90.0 05/06/2023   PLT 249 05/06/2023   Lab Results  Component  Value Date   NA 139 05/06/2023   K 3.7 05/06/2023   CL 108 05/06/2023   CO2 23 05/06/2023    RADIOGRAPHIC STUDIES: I have personally reviewed the radiological reports and agreed with the findings in the report.  ASSESSMENT AND PLAN:  Ductal carcinoma in situ (DCIS) of left breast This is a very pleasant 42 year old premenopausal female patient with left breast ER/PR positive DCIS referred to breast [facility] for additional recommendations.  Pathology review: I discussed with the patient the difference between DCIS and invasive breast cancer. It is considered a precancerous lesion. DCIS is classified as a Stage 0 breast cancer. It is generally detected through mammograms as calcifications. We discussed the significance of grades and its impact on prognosis. We also discussed the importance of ER and PR receptors and their implications to adjuvant treatment options. Prognosis of DCIS dependence on grade and degree of comedo necrosis. It is anticipated that if not treated, 20-30% of DCIS can develop into invasive breast cancer.  Recommendation: 1. Breast conserving surgery 2. Followed by adjuvant radiation therapy 3. Followed by antiestrogen therapy with tamoxifen given her premenopausal status.    Tamoxifen counseling: We discussed the risks and benefits of tamoxifen. These include but  not limited to insomnia, hot flashes, mood changes, vaginal dryness, and weight gain. Although rare, serious side effects including endometrial cancer, risk of blood clots were also discussed. We strongly believe that the benefits far outweigh the risks. Patient understands these risks and consented to starting treatment. Planned treatment duration is 5 years.  She understands the plan.  She will return to clinic after completion of adjuvant radiation.  She understands that sometimes there could be an upstaging in the tumor after surgery.  If she were to have any invasive breast cancer, then we may have to see her after  surgery before adjuvant radiation.   All questions were answered. The patient knows to call the clinic with any problems, questions or concerns.    [name], MD 05/06/23

## 2023-05-06 NOTE — Assessment & Plan Note (Signed)
This is a very pleasant 42 year old premenopausal female patient with left breast ER/PR positive DCIS referred to breast [facility] for additional recommendations.  Pathology review: I discussed with the patient the difference between DCIS and invasive breast cancer. It is considered a precancerous lesion. DCIS is classified as a Stage 0 breast cancer. It is generally detected through mammograms as calcifications. We discussed the significance of grades and its impact on prognosis. We also discussed the importance of ER and PR receptors and their implications to adjuvant treatment options. Prognosis of DCIS dependence on grade and degree of comedo necrosis. It is anticipated that if not treated, 20-30% of DCIS can develop into invasive breast cancer.  Recommendation: 1. Breast conserving surgery 2. Followed by adjuvant radiation therapy 3. Followed by antiestrogen therapy with tamoxifen given her premenopausal status.    Tamoxifen counseling: We discussed the risks and benefits of tamoxifen. These include but not limited to insomnia, hot flashes, mood changes, vaginal dryness, and weight gain. Although rare, serious side effects including endometrial cancer, risk of blood clots were also discussed. We strongly believe that the benefits far outweigh the risks. Patient understands these risks and consented to starting treatment. Planned treatment duration is 5 years.  She understands the plan.  She will return to clinic after completion of adjuvant radiation.  She understands that sometimes there could be an upstaging in the tumor after surgery.  If she were to have any invasive breast cancer, then we may have to see her after surgery before adjuvant radiation.

## 2023-05-06 NOTE — Progress Notes (Deleted)
Radiation Oncology         [phone] ________________________________  Name: Tami Howard        MRN: [id number]  Date of Service: 05/06/2023 DOB: 05/14/1981  WU:[id number], [name], NP  [name], MD     REFERRING PHYSICIAN: [name], MD   DIAGNOSIS: The encounter diagnosis was Ductal carcinoma in situ (DCIS) of left breast.   HISTORY OF PRESENT ILLNESS: Tami Howard is a 42 y.o. female seen in the multidisciplinary breast clinic for a new diagnosis of left breast cancer. She presented with screening detected mass in the left breast and further diagnostic workup identified a mass in the 11 o'clock position that was hypoechoic measuring 1.3 cm there is 1 abnormal appearing lymph node with focal thickening measuring 1.2 cm in the left axilla.  She underwent biopsies on 04/27/2023, her breast biopsy showed a grade 2 invasive mammary carcinoma with lobular features, the cancer was ER/PR positive HER2 negative with a Ki-67 of 30%.  Her lymph node showed metastatic carcinoma.  She is seen today to discuss treatment recommendations of her cancer.    PREVIOUS RADIATION THERAPY: No   PAST MEDICAL HISTORY: No past medical history on file.     PAST SURGICAL HISTORY: Past Surgical History:  Procedure Laterality Date   BREAST BIOPSY Left 04/27/2023   MM LT BREAST BX W LOC DEV 1ST LESION IMAGE BX SPEC STEREO GUIDE 04/27/2023 [facility] MAMMOGRAP[facility]   DIAGNOSTIC LAPAROSCOPY WITH REMOVAL OF ECTOPIC PREGNANCY Left    pt believes on left side, was able to successfully have children after procedure   ROTATOR CUFF REPAIR       FAMILY HISTORY:  Family History  Problem Relation Age of Onset   Breast cancer Other      SOCIAL HISTORY:  reports that she quit smoking about 6 months ago. Her smoking use included cigarettes. She started smoking about 26 years ago. She has never used smokeless tobacco. She reports current alcohol use. She reports that she does not currently use drugs  after having used the following drugs: Marijuana. The patient is married and lives in [place]. 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 She is accompanied by her husband, and her mother and adult son are remotely joining as well on FaceTime.    ALLERGIES: Patient has no known allergies.   MEDICATIONS:  Current Outpatient Medications  Medication Sig Dispense Refill   Multiple Vitamins-Minerals (MULTIPLE VITAMINS/WOMENS PO) Take by mouth.     No current facility-administered medications for this encounter.     REVIEW OF SYSTEMS: On review of systems, the patient reports that she is doing well. She's been nervous about her diagnosis and unsure what therapy she would need, but feels more at ease after hearing discussion thusfar. No breast specific complaints are noted.      PHYSICAL EXAM:  Wt Readings from Last 3 Encounters:  05/06/23 172 lb 9.6 oz (78.3 kg)  02/25/23 170 lb 6.4 oz (77.3 kg)  02/06/23 170 lb 3.2 oz (77.2 kg)   Temp Readings from Last 3 Encounters:  05/06/23 98.1 F (36.7 C) (Tympanic)  08/27/22 98 F (36.7 C) (Temporal)  06/11/22 97.9 F (36.6 C) (Oral)   BP Readings from Last 3 Encounters:  05/06/23 121/82  02/25/23 110/76  02/06/23 108/79   Pulse Readings from Last 3 Encounters:  05/06/23 83  02/25/23 78  02/06/23 79    In general this is a well appearing African American female in no acute distress. She's  alert and oriented x4 and appropriate throughout the examination. Cardiopulmonary assessment is negative for acute distress and she exhibits normal effort. Bilateral breast exam is deferred.    ECOG = 1  0 - Asymptomatic (Fully active, able to carry on all predisease activities without restriction)  1 - Symptomatic but completely ambulatory (Restricted in physically strenuous activity but ambulatory and able to carry out work of a light or sedentary nature. For example, light housework, office work)  2 - Symptomatic, <50% in bed during the  day (Ambulatory and capable of all self care but unable to carry out any work activities. Up and about more than 50% of waking hours)  3 - Symptomatic, >50% in bed, but not bedbound (Capable of only limited self-care, confined to bed or chair 50% or more of waking hours)  4 - Bedbound (Completely disabled. Cannot carry on any self-care. Totally confined to bed or chair)  5 - Death   Santiago Glad MM, Creech RH, Tormey DC, et al. [phone]). "Toxicity and response criteria of the [employer]". Am. Evlyn Clines. Oncol. 5 (6): 649-55    LABORATORY DATA:  Lab Results  Component Value Date   WBC 8.0 05/06/2023   HGB 13.0 05/06/2023   HCT 38.5 05/06/2023   MCV 90.0 05/06/2023   PLT 249 05/06/2023   Lab Results  Component Value Date   NA 139 05/06/2023   K 3.7 05/06/2023   CL 108 05/06/2023   CO2 23 05/06/2023   Lab Results  Component Value Date   ALT 14 05/06/2023   AST 14 (L) 05/06/2023   ALKPHOS 51 05/06/2023   BILITOT 0.4 05/06/2023      RADIOGRAPHY: MM LT BREAST BX W LOC DEV 1ST LESION IMAGE BX SPEC STEREO GUIDE  Addendum Date: 04/29/2023   ADDENDUM REPORT: 04/29/2023 15:50 ADDENDUM: Pathology revealed DUCTAL CARCINOMA IN SITU, PREDOMINANTLY CRIBRIFORM TYPE WITH FOCAL NECROSIS AND ASSOCIATED CALCIFICATIONS, NUCLEAR GRADE 2 OF 3, NECROSIS: FOCALLY PRESENT, CALCIFICATIONS: PRESENT of the LEFT breast, upper outer, (coil clip). This was found to be concordant by Dr. Emmaline Kluver. Pathology results were discussed with the patient by telephone. The patient reported doing well after the biopsy with tenderness at the site. Post biopsy instructions and care were reviewed and questions were answered. The patient was encouraged to call [facility] for any additional concerns. My direct phone number was provided. The patient was referred to [facility] on May 06, 2023. Pathology  results reported by Rene Kocher, RN on 04/28/2023. Electronically Signed   By: Emmaline Kluver M.D.   On: 04/29/2023 15:50   Result Date: 04/29/2023 CLINICAL DATA:  42 year old female presenting for biopsy of calcifications in the left breast. EXAM: LEFT BREAST STEREOTACTIC CORE NEEDLE BIOPSY COMPARISON:  Previous exam(s). FINDINGS: The patient and I discussed the procedure of stereotactic-guided biopsy including benefits and alternatives. We discussed the high likelihood of a successful procedure. We discussed the risks of the procedure including infection, bleeding, tissue injury, clip migration, and inadequate sampling. Informed written consent was given. The usual time out protocol was performed immediately prior to the procedure. Using sterile technique and 1% Lidocaine as local anesthetic, under stereotactic guidance, a 9 gauge vacuum assisted device was used to perform core needle biopsy of calcifications in the upper outer left breast using a superior approach. Specimen radiograph was performed showing 1 specimen with calcifications. Specimens with calcifications are identified for pathology. Lesion quadrant:  Upper outer quadrant At the conclusion of the procedure, a coil shaped tissue marker clip was deployed into the biopsy cavity. Follow-up 2-view mammogram was performed and dictated separately. IMPRESSION: Stereotactic-guided biopsy of calcifications in the upper outer left breast. No apparent complications. Electronically Signed: By: Emmaline Kluver M.D. On: 04/27/2023 12:18   MM CLIP PLACEMENT LEFT  Result Date: 04/27/2023 CLINICAL DATA:  Post procedure mammogram for clip placement EXAM: 3D DIAGNOSTIC LEFT MAMMOGRAM POST STEREOTACTIC BIOPSY COMPARISON:  Previous exam(s). FINDINGS: 3D Mammographic images were obtained following stereotactic guided biopsy of calcifications in the upper outer left breast. The biopsy marking clip is in expected position at the site of biopsy. IMPRESSION:  Appropriate positioning of the coil shaped biopsy marking clip at the site of biopsy in the upper outer left breast. Final Assessment: Post Procedure Mammograms for Marker Placement Electronically Signed   By: Emmaline Kluver M.D.   On: 04/27/2023 12:17  MM Digital Diagnostic Unilat L  Result Date: 04/08/2023 CLINICAL DATA:  Patient returns after screening for evaluation of LEFT breast calcifications. EXAM: DIGITAL DIAGNOSTIC UNILATERAL LEFT MAMMOGRAM WITH CAD TECHNIQUE: Left digital diagnostic mammography was performed. COMPARISON:  Previous exam(s). ACR Breast Density Category d: The breasts are extremely dense, which lowers the sensitivity of mammography. FINDINGS: Magnified views are performed of calcifications in the UPPER-OUTER QUADRANT of the LEFT breast. These views demonstrate layering calcifications spanning 5 millimeters. Calcifications have no suspicious morphology or distribution. IMPRESSION: Probably benign calcifications in the LEFT breast, likely fibrocystic changes. We discussed management options including biopsy and close follow-up. Imaging followup is recommended at 6, 12, and 24 months to assess stability. The patient concurs with this plan. RECOMMENDATION: Recommend LEFT diagnostic mammogram in 6 months. I have discussed the findings and recommendations with the patient. If applicable, a reminder letter will be sent to the patient regarding the next appointment. BI-RADS CATEGORY  3: Probably benign. Electronically Signed   By: Norva Pavlov M.D.   On: 04/08/2023 11:54      IMPRESSION/PLAN: 1. Intermediate grade, ER/PR positive DCIS of the left breast.Dr. Mitzi Hansen discusses the pathology findings and reviews the nature of noninvasive left breast disease. The consensus from the breast conference includes breast conservation with lumpectomy. Dr. Mitzi Hansen recommends external radiotherapy to the breast  to reduce risks of local recurrence followed by antiestrogen therapy. We discussed the  risks, benefits, short, and long term effects of radiotherapy, as well as the curative intent, and the patient is interested in proceeding. Dr. Mitzi Hansen discusses the delivery and logistics of radiotherapy and anticipates a course of 6 1/2 weeks of radiotherapy to the left breast with deep inspiration breath hold technique, though she is aware of the possibility that insurance may only cover a hypofractionacted course, though with her young age, Dr. Mitzi Hansen prefers the 6 1/2 week regimen with decades of outcome data for a patient who should anticipate multiple decades of life ahead. We will see her back a few weeks after surgery to discuss the simulation process and anticipate we starting radiotherapy about 4-6 weeks after surgery.  2. Possible genetic predisposition to malignancy. The patient is a candidate for genetic testing given her personal  history. She will meet with our geneticist today in clinic. 3. Contraceptive Counseling. The patient has an IUD and is aware of the need for a negative home pregnancy test prior to radiation simulation and treatment. She is counseled on the use of condoms with her partner during therapy as well.   In a visit lasting 60  minutes, greater than 50% of the time was spent face to face reviewing her case, as well as in preparation of, discussing, and coordinating the patient's care.  The above documentation reflects my direct findings during this shared patient visit. Please see the separate note by Dr. Mitzi Hansen on this date for the remainder of the patient's plan of care.    Osker Mason, [facility]    **Disclaimer: This note was dictated with voice recognition software. Similar sounding words can inadvertently be transcribed and this note may contain transcription errors which may not have been corrected upon publication of note.**

## 2023-05-07 ENCOUNTER — Encounter: Payer: Self-pay | Admitting: General Practice

## 2023-05-07 ENCOUNTER — Telehealth: Payer: Self-pay | Admitting: *Deleted

## 2023-05-07 ENCOUNTER — Encounter: Payer: Self-pay | Admitting: *Deleted

## 2023-05-07 NOTE — Progress Notes (Signed)
[facility] Multidisciplinary Clinic Spiritual Care Note  Met with Tami Howard by phone following Breast Multidisciplinary Clinic to introduce Support Center team/resources.  she completed SDOH screening; results follow below.  SDOH Interventions    Flowsheet Row Office Visit from [facility]/25/2023 in [facility]  SDOH Interventions   Depression Interventions/Treatment  Patient refuses Treatment       SDOH Screenings   Food Insecurity: No Food Insecurity (05/07/2023)  Housing: Low Risk  (05/07/2023)  Transportation Needs: No Transportation Needs (05/07/2023)  Utilities: Not At Risk (05/07/2023)  Depression (PHQ2-9): High Risk (02/25/2023)  Tobacco Use: High Risk (05/06/2023)   Received from [facility] and patient discussed common feelings and emotions when being diagnosed with cancer, and the importance of support during treatment.  Chaplain informed patient of the support team and support services at [facility].  Chaplain provided contact information and encouraged patient to call with any questions or concerns.  Tami Howard reports that she is going to [place] next week for her cousin's wedding, which is giving her a meaningful distraction to look forward to. We talked about having a self-care plan in place afterward to help avoid the possibility of a significant emotional crash after the much-anticipated adventure. She also notes that she is beginning to work with a Veterinary surgeon for additional support.  Tami Howard welcomes an Sports coach referral and plans to review Energy manager Care/[facility] support programming information for activities to help her stay focused and enjoying life in the midst of distress.  Follow up needed: Yes.   Tami Howard has direct Spiritual Care number, and we plan to follow up by phone in ca two weeks. Placing [facility] Guide referral per her request.   Konrad Dolores, [facility] Pager [phone] Voicemail  [phone]

## 2023-05-07 NOTE — Telephone Encounter (Signed)
Left message for a return phone call to follow up from [facility] 7/17

## 2023-05-11 ENCOUNTER — Other Ambulatory Visit: Payer: Medicaid Other

## 2023-05-12 ENCOUNTER — Encounter: Payer: Self-pay | Admitting: Genetic Counselor

## 2023-05-12 ENCOUNTER — Telehealth: Payer: Self-pay | Admitting: Genetic Counselor

## 2023-05-12 DIAGNOSIS — Z1379 Encounter for other screening for genetic and chromosomal anomalies: Secondary | ICD-10-CM | POA: Insufficient documentation

## 2023-05-12 NOTE — Telephone Encounter (Addendum)
I contacted Ms. Trine to discuss her genetic testing results. No pathogenic variants were identified in the 9 genes analyzed. Of note, the pan-cancer panel is pending. Detailed clinic note to follow.  The test report has been scanned into EPIC and is located under the Molecular Pathology section of the Results Review tab.  A portion of the result report is included below for reference.   Lalla Brothers, MS, [facility] Genetic Counselor [email or web address] (P) [phone]

## 2023-05-15 ENCOUNTER — Ambulatory Visit (HOSPITAL_COMMUNITY)
Admission: RE | Admit: 2023-05-15 | Discharge: 2023-05-15 | Disposition: A | Discharge status: Home / Self Care | Payer: Medicaid Other | Source: Ambulatory Visit | Attending: General Surgery | Admitting: General Surgery

## 2023-05-15 ENCOUNTER — Other Ambulatory Visit: Payer: Self-pay | Admitting: General Surgery

## 2023-05-15 DIAGNOSIS — D0512 Intraductal carcinoma in situ of left breast: Secondary | ICD-10-CM

## 2023-05-15 MED ORDER — GADOBUTROL 1 MMOL/ML IV SOLN
7.5000 mL | Freq: Once | INTRAVENOUS | Status: AC | PRN
  Administered 2023-05-15: 7.5 mL via INTRAVENOUS

## 2023-05-18 ENCOUNTER — Other Ambulatory Visit: Payer: Self-pay | Admitting: General Surgery

## 2023-05-18 ENCOUNTER — Encounter: Payer: Self-pay | Admitting: *Deleted

## 2023-05-18 DIAGNOSIS — D0512 Intraductal carcinoma in situ of left breast: Secondary | ICD-10-CM

## 2023-05-19 ENCOUNTER — Encounter: Payer: Self-pay | Admitting: General Practice

## 2023-05-19 ENCOUNTER — Other Ambulatory Visit: Payer: Self-pay

## 2023-05-19 ENCOUNTER — Encounter: Payer: Self-pay | Admitting: *Deleted

## 2023-05-19 ENCOUNTER — Encounter (HOSPITAL_BASED_OUTPATIENT_CLINIC_OR_DEPARTMENT_OTHER): Payer: Self-pay | Admitting: General Surgery

## 2023-05-19 ENCOUNTER — Telehealth: Payer: Self-pay | Admitting: *Deleted

## 2023-05-19 DIAGNOSIS — D0512 Intraductal carcinoma in situ of left breast: Secondary | ICD-10-CM

## 2023-05-19 DIAGNOSIS — R928 Other abnormal and inconclusive findings on diagnostic imaging of breast: Secondary | ICD-10-CM

## 2023-05-19 NOTE — Progress Notes (Signed)
[facility] Spiritual Care Note  Reached Anisa by phone at an inconvenient time. We plan to follow up later in the day/week as needed.   [address] Rush Barer, [place], [facility] Pager [phone] Voicemail [phone]

## 2023-05-19 NOTE — Progress Notes (Signed)
[facility] Spiritual Care Note  Followed up with Mekenna by phone. She is being proactive about her care by phoning for interpretation of MRI results, scheduling bra appointment prior to surgery, and reviewing breast cancer Journey binder and [facility] support programming info for resources of interest. She reports good support and had a meaningful family time at her cousin's recent wedding in [place].  We plan to follow up preoperatively and postoperatively to provide her opportunities for processing and reflection. Shiffon also has direct Spiritual Care number in case needs arise in the meantime.   [address] Rush Barer, [place], [facility] Pager [phone] Voicemail [phone]

## 2023-05-19 NOTE — Telephone Encounter (Signed)
Spoke with patient to discuss MRI results and the need for add imaging?bx's. Informed her that she will be called by the breast center with an appt. Patient verbalized understanding.

## 2023-05-20 ENCOUNTER — Encounter: Payer: Self-pay | Admitting: *Deleted

## 2023-05-21 ENCOUNTER — Telehealth: Payer: Self-pay | Admitting: Genetic Counselor

## 2023-05-21 DIAGNOSIS — Z419 Encounter for procedure for purposes other than remedying health state, unspecified: Secondary | ICD-10-CM | POA: Diagnosis not present

## 2023-05-21 NOTE — Telephone Encounter (Addendum)
I contacted Tami Howard to discuss her genetic testing results. No pathogenic variants were identified in the 70 genes analyzed. 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 Detailed clinic note to follow.  The test report has been scanned into EPIC and is located under the Molecular Pathology section of the Results Review tab.  A portion of the result report is included below for reference.   Lalla Brothers, MS, [facility] Genetic Counselor [email or web address] (P) [phone]

## 2023-05-26 ENCOUNTER — Ambulatory Visit
Admission: RE | Admit: 2023-05-26 | Discharge: 2023-05-26 | Disposition: A | Discharge status: Home / Self Care | Payer: Medicaid Other | Source: Ambulatory Visit | Attending: General Surgery | Admitting: General Surgery

## 2023-05-26 ENCOUNTER — Other Ambulatory Visit: Payer: Self-pay | Admitting: General Surgery

## 2023-05-26 ENCOUNTER — Ambulatory Visit: Admission: RE | Admit: 2023-05-26 | Payer: Medicaid Other | Source: Ambulatory Visit

## 2023-05-26 DIAGNOSIS — D0512 Intraductal carcinoma in situ of left breast: Secondary | ICD-10-CM

## 2023-05-26 DIAGNOSIS — N6312 Unspecified lump in the right breast, upper inner quadrant: Secondary | ICD-10-CM | POA: Diagnosis not present

## 2023-05-26 DIAGNOSIS — R590 Localized enlarged lymph nodes: Secondary | ICD-10-CM | POA: Diagnosis not present

## 2023-05-26 DIAGNOSIS — R928 Other abnormal and inconclusive findings on diagnostic imaging of breast: Secondary | ICD-10-CM

## 2023-05-26 DIAGNOSIS — N6314 Unspecified lump in the right breast, lower inner quadrant: Secondary | ICD-10-CM | POA: Diagnosis not present

## 2023-05-26 DIAGNOSIS — N6341 Unspecified lump in right breast, subareolar: Secondary | ICD-10-CM | POA: Diagnosis not present

## 2023-05-26 DIAGNOSIS — N6322 Unspecified lump in the left breast, upper inner quadrant: Secondary | ICD-10-CM | POA: Diagnosis not present

## 2023-05-26 DIAGNOSIS — N6002 Solitary cyst of left breast: Secondary | ICD-10-CM | POA: Diagnosis not present

## 2023-05-26 DIAGNOSIS — N6011 Diffuse cystic mastopathy of right breast: Secondary | ICD-10-CM | POA: Diagnosis not present

## 2023-05-26 HISTORY — PX: BREAST BIOPSY: SHX20

## 2023-05-27 ENCOUNTER — Other Ambulatory Visit: Payer: Self-pay | Admitting: General Surgery

## 2023-05-27 ENCOUNTER — Encounter: Payer: Self-pay | Admitting: *Deleted

## 2023-05-27 DIAGNOSIS — C50912 Malignant neoplasm of unspecified site of left female breast: Secondary | ICD-10-CM | POA: Diagnosis not present

## 2023-05-27 DIAGNOSIS — F411 Generalized anxiety disorder: Secondary | ICD-10-CM | POA: Diagnosis not present

## 2023-05-27 DIAGNOSIS — R928 Other abnormal and inconclusive findings on diagnostic imaging of breast: Secondary | ICD-10-CM

## 2023-05-27 DIAGNOSIS — F4320 Adjustment disorder, unspecified: Secondary | ICD-10-CM | POA: Diagnosis not present

## 2023-05-27 DIAGNOSIS — D0512 Intraductal carcinoma in situ of left breast: Secondary | ICD-10-CM

## 2023-05-27 NOTE — Progress Notes (Signed)

## 2023-05-28 ENCOUNTER — Ambulatory Visit
Admission: RE | Admit: 2023-05-28 | Discharge: 2023-05-28 | Disposition: A | Discharge status: Home / Self Care | Payer: Medicaid Other | Source: Ambulatory Visit | Attending: General Surgery | Admitting: General Surgery

## 2023-05-28 DIAGNOSIS — N6314 Unspecified lump in the right breast, lower inner quadrant: Secondary | ICD-10-CM | POA: Diagnosis not present

## 2023-05-28 DIAGNOSIS — R928 Other abnormal and inconclusive findings on diagnostic imaging of breast: Secondary | ICD-10-CM

## 2023-05-28 DIAGNOSIS — N6322 Unspecified lump in the left breast, upper inner quadrant: Secondary | ICD-10-CM | POA: Diagnosis not present

## 2023-05-28 DIAGNOSIS — D241 Benign neoplasm of right breast: Secondary | ICD-10-CM | POA: Diagnosis not present

## 2023-05-28 DIAGNOSIS — N6312 Unspecified lump in the right breast, upper inner quadrant: Secondary | ICD-10-CM | POA: Diagnosis not present

## 2023-05-28 DIAGNOSIS — N6011 Diffuse cystic mastopathy of right breast: Secondary | ICD-10-CM | POA: Diagnosis not present

## 2023-05-28 MED ORDER — GADOBUTROL 1 MMOL/ML IV SOLN
10.0000 mL | Freq: Once | INTRAVENOUS | Status: AC | PRN
  Administered 2023-05-28: 10 mL via INTRAVENOUS

## 2023-05-29 ENCOUNTER — Encounter: Payer: Self-pay | Admitting: General Practice

## 2023-05-29 NOTE — Progress Notes (Signed)
[facility] Spiritual Care Note  Followed up with U[place] by phone in preparation for her surgery next week. She notes that she is doing ok overall, just feeling unsettled as she awaits more results and consequent details about surgery. She also shared that her mom is coming down to support her around the surgery, which feels good because they have a positive relationship.  Provided empathic listening, emotional support, normalization of feelings, and affirmation of strengths. We plan to follow up again by phone postoperatively.   [address] Rush Barer, [place], [facility] Pager [phone] Voicemail [phone]

## 2023-06-01 ENCOUNTER — Ambulatory Visit: Payer: Self-pay | Admitting: Genetic Counselor

## 2023-06-01 ENCOUNTER — Other Ambulatory Visit: Payer: Self-pay | Admitting: General Surgery

## 2023-06-01 ENCOUNTER — Encounter: Payer: Self-pay | Admitting: Genetic Counselor

## 2023-06-01 DIAGNOSIS — N6310 Unspecified lump in the right breast, unspecified quadrant: Secondary | ICD-10-CM

## 2023-06-01 DIAGNOSIS — Z1379 Encounter for other screening for genetic and chromosomal anomalies: Secondary | ICD-10-CM

## 2023-06-01 NOTE — Progress Notes (Signed)
HPI:   Tami Howard was previously seen in the [facility] due to a personal and family history of cancer and concerns regarding a hereditary predisposition to cancer. Please refer to our prior cancer genetics clinic note for more information regarding our discussion, assessment and recommendations, at the time. Tami Howard's recent genetic test results were disclosed to her, as were recommendations warranted by these results. These results and recommendations are discussed in more detail below.  CANCER HISTORY:  Oncology History  Ductal carcinoma in situ (DCIS) of left breast  03/19/2023 Mammogram   Patient had bilateral screening mammogram which showed possible calcifications in the left breast.  Left breast diagnostic mammogram showed extremely dense breasts and recommendation was to consider repeating the mammogram in 6 months versus considering biopsy.  She initially wanted to wait but after discussing with her family decided to pursue biopsy.   04/27/2023 Pathology Results   Left breast needle core biopsy showed DCIS, predominantly cribriform type with focal necrosis and associated calcifications prognostic showed ER 99% positive moderate to strong staining PR 100% positive strong staining   05/04/2023 Initial Diagnosis   Ductal carcinoma in situ (DCIS) of left breast    Genetic Testing   [facility] Multi-Cancer Panel+RNA was Negative. 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 Report date is 05/19/2023.  The Multi-Cancer + RNA Panel offered by [facility] includes sequencing and/or deletion/duplication analysis of the following 70 genes:  AIP*, ALK, APC*, ATM*, AXIN2*, BAP1*, BARD1*, BLM*, BMPR1A*, BRCA1*, BRCA2*, BRIP1*, CDC73*, CDH1*, CDK4, CDKN1B*, CDKN2A, CHEK2*, CTNNA1*, DICER1*, EPCAM (del/dup only), EGFR, FH*, FLCN*, GREM1 (promoter dup only), HOXB13, KIT, LZTR1, MAX*, MBD4, MEN1*, MET, MITF, MLH1*, MSH2*, MSH3*, MSH6*, MUTYH*,  NF1*, NF2*, NTHL1*, PALB2*, PDGFRA, PMS2*, POLD1*, POLE*, POT1*, PRKAR1A*, PTCH1*, PTEN*, RAD51C*, RAD51D*, RB1*, RET, SDHA* (sequencing only), SDHAF2*, SDHB*, SDHC*, SDHD*, SMAD4*, SMARCA4*, SMARCB1*, SMARCE1*, STK11*, SUFU*, TMEM127*, TP53*, TSC1*, TSC2*, VHL*. RNA analysis is performed for * genes.     FAMILY HISTORY:  We obtained a detailed, 4-generation family history.  Significant diagnoses are listed below:      Family History  Problem Relation Age of Onset   Kidney cancer Mother 55   Liver cancer Maternal Grandmother     Liver cancer Maternal Grandfather     Breast cancer Paternal Grandmother 46   Breast cancer Other             Tami Howard mother was diagnosed with kidney cancer at age 57. Her maternal grandmother and maternal grandfather were diagnosed with liver cancer, they are both deceased. Tami Howard's paternal grandmother was diagnosed with breast cancer at age 85. Tami Howard is unaware of previous family history of genetic testing for hereditary cancer risks. There is no reported Ashkenazi Jewish ancestry.    GENETIC TEST RESULTS:  The [facility] Multi-Cancer Panel found no pathogenic mutations.   The Multi-Cancer + RNA Panel offered by [facility] includes sequencing and/or deletion/duplication analysis of the following 70 genes:  AIP*, ALK, APC*, ATM*, AXIN2*, BAP1*, BARD1*, BLM*, BMPR1A*, BRCA1*, BRCA2*, BRIP1*, CDC73*, CDH1*, CDK4, CDKN1B*, CDKN2A, CHEK2*, CTNNA1*, DICER1*, EPCAM (del/dup only), EGFR, FH*, FLCN*, GREM1 (promoter dup only), HOXB13, KIT, LZTR1, MAX*, MBD4, MEN1*, MET, MITF, MLH1*, MSH2*, MSH3*, MSH6*, MUTYH*, NF1*, NF2*, NTHL1*, PALB2*, PDGFRA, PMS2*, POLD1*, POLE*, POT1*, PRKAR1A*, PTCH1*, PTEN*, RAD51C*, RAD51D*, RB1*, RET, SDHA* (sequencing only), SDHAF2*, SDHB*, SDHC*, SDHD*, SMAD4*, SMARCA4*, SMARCB1*, SMARCE1*, STK11*, SUFU*, TMEM127*, TP53*, TSC1*, TSC2*, VHL*. RNA analysis is performed for * genes.  The  test report has been scanned into EPIC and  is located under the Molecular Pathology section of the Results Review tab.  A portion of the result report is included below for reference. Genetic testing reported out on 05/19/2023.     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  At this time, it is unknown if this variant is associated with an increased risk for cancer or if it is benign, but most uncertain variants are reclassified to benign. It should not be used to make medical management decisions. With time, we suspect the laboratory will determine the significance of this variant, if any. If the laboratory reclassifies this variant, we will attempt to contact Tami Howard to discuss it further.   Even though a pathogenic variant was not identified, possible explanations for the cancer in the family may include: There may be no hereditary risk for cancer in the family. The cancers in Ms. Geno and/or her family may be due to other genetic or environmental factors. There may be a gene mutation in one of these genes that current testing methods cannot detect, but that chance is small. There could be another gene that has not yet been discovered, or that we have not yet tested, that is responsible for the cancer diagnoses in the family.   Therefore, it is important to remain in touch with cancer genetics in the future so that we can continue to offer Tami Howard the most up to date genetic testing.   ADDITIONAL GENETIC TESTING:  We discussed with Tami Howard that her genetic testing was fairly extensive.  If there are genes identified to increase cancer risk that can be analyzed in the future, we would be happy to discuss and coordinate this testing at that time.    CANCER SCREENING RECOMMENDATIONS:  Tami Howard test result is considered negative (normal).  This means that we have not identified a hereditary cause for her personal and family history of cancer at this time.    An individual's cancer risk and medical management are not determined by genetic test results alone. Overall cancer risk assessment incorporates additional factors, including personal medical history, family history, and any available genetic information that may result in a personalized plan for cancer prevention and surveillance. Therefore, it is recommended she continue to follow the cancer management and screening guidelines provided by her oncology and primary healthcare provider.  RECOMMENDATIONS FOR FAMILY MEMBERS:   Since she did not inherit a mutation in a cancer predisposition gene included on this panel, her children could not have inherited a mutation from her in one of these genes. Individuals in this family might be at some increased risk of developing cancer, over the general population risk, due to the family history of cancer. We recommend women in this family have a yearly mammogram beginning at age 57, or 47 years younger than the earliest onset of cancer, an annual clinical breast exam, and perform monthly breast self-exams. We do not recommend familial testing for the RET variant of uncertain significance (VUS).  FOLLOW-UP:  Cancer genetics is a rapidly advancing field and it is possible that new genetic tests will be appropriate for her and/or her family members in the future. We encouraged her to remain in contact with cancer genetics on an annual basis so we can update her personal and family histories and let her know of advances in cancer genetics that may benefit this family.   Our contact number was  provided. Tami Howard's questions were answered to her satisfaction, and she knows she is welcome to call us at anytime with additional questions or concerns.   Lalla Brothers, MS, [facility] Genetic Counselor [email or web address] (P) [phone]

## 2023-06-02 ENCOUNTER — Ambulatory Visit
Admission: RE | Admit: 2023-06-02 | Discharge: 2023-06-02 | Disposition: A | Discharge status: Home / Self Care | Payer: Medicaid Other | Source: Ambulatory Visit | Attending: General Surgery | Admitting: General Surgery

## 2023-06-02 DIAGNOSIS — D0512 Intraductal carcinoma in situ of left breast: Secondary | ICD-10-CM | POA: Diagnosis not present

## 2023-06-02 HISTORY — PX: BREAST BIOPSY: SHX20

## 2023-06-03 ENCOUNTER — Ambulatory Visit: Admit: 2023-06-03 | Payer: Medicaid Other

## 2023-06-03 ENCOUNTER — Ambulatory Visit
Admission: RE | Admit: 2023-06-03 | Discharge: 2023-06-03 | Disposition: A | Discharge status: Home / Self Care | Payer: Medicaid Other | Source: Ambulatory Visit | Attending: General Surgery | Admitting: General Surgery

## 2023-06-03 ENCOUNTER — Ambulatory Visit (HOSPITAL_BASED_OUTPATIENT_CLINIC_OR_DEPARTMENT_OTHER): Payer: Medicaid Other | Admitting: Certified Registered"

## 2023-06-03 ENCOUNTER — Other Ambulatory Visit: Payer: Self-pay

## 2023-06-03 ENCOUNTER — Ambulatory Visit (HOSPITAL_BASED_OUTPATIENT_CLINIC_OR_DEPARTMENT_OTHER)
Admission: RE | Admit: 2023-06-03 | Discharge: 2023-06-03 | Disposition: A | Discharge status: Home / Self Care | Payer: Medicaid Other | Attending: General Surgery | Admitting: General Surgery

## 2023-06-03 ENCOUNTER — Encounter (HOSPITAL_BASED_OUTPATIENT_CLINIC_OR_DEPARTMENT_OTHER)
Admission: RE | Disposition: A | Discharge status: Home / Self Care | Payer: Self-pay | Source: Home / Self Care | Attending: General Surgery

## 2023-06-03 ENCOUNTER — Ambulatory Visit
Admit: 2023-06-03 | Discharge: 2023-06-03 | Disposition: A | Discharge status: Home / Self Care | Payer: Medicaid Other | Attending: General Surgery | Admitting: General Surgery

## 2023-06-03 ENCOUNTER — Encounter (HOSPITAL_BASED_OUTPATIENT_CLINIC_OR_DEPARTMENT_OTHER): Payer: Self-pay | Admitting: General Surgery

## 2023-06-03 DIAGNOSIS — N6012 Diffuse cystic mastopathy of left breast: Secondary | ICD-10-CM | POA: Diagnosis not present

## 2023-06-03 DIAGNOSIS — N62 Hypertrophy of breast: Secondary | ICD-10-CM | POA: Diagnosis not present

## 2023-06-03 DIAGNOSIS — N6310 Unspecified lump in the right breast, unspecified quadrant: Secondary | ICD-10-CM

## 2023-06-03 DIAGNOSIS — Z87891 Personal history of nicotine dependence: Secondary | ICD-10-CM | POA: Insufficient documentation

## 2023-06-03 DIAGNOSIS — N6081 Other benign mammary dysplasias of right breast: Secondary | ICD-10-CM | POA: Diagnosis not present

## 2023-06-03 DIAGNOSIS — D0512 Intraductal carcinoma in situ of left breast: Secondary | ICD-10-CM

## 2023-06-03 DIAGNOSIS — D0592 Unspecified type of carcinoma in situ of left breast: Secondary | ICD-10-CM | POA: Diagnosis not present

## 2023-06-03 DIAGNOSIS — D241 Benign neoplasm of right breast: Secondary | ICD-10-CM | POA: Insufficient documentation

## 2023-06-03 DIAGNOSIS — N6021 Fibroadenosis of right breast: Secondary | ICD-10-CM | POA: Diagnosis not present

## 2023-06-03 DIAGNOSIS — Z01818 Encounter for other preprocedural examination: Secondary | ICD-10-CM

## 2023-06-03 HISTORY — PX: EXCISION OF BREAST BIOPSY: SHX5822

## 2023-06-03 HISTORY — PX: BREAST LUMPECTOMY WITH RADIOACTIVE SEED LOCALIZATION: SHX6424

## 2023-06-03 HISTORY — PX: BREAST BIOPSY: SHX20

## 2023-06-03 LAB — POCT PREGNANCY, URINE: Preg Test, Ur: NEGATIVE

## 2023-06-03 SURGERY — BREAST LUMPECTOMY WITH RADIOACTIVE SEED LOCALIZATION
Anesthesia: General | Site: Breast | Laterality: Right

## 2023-06-03 MED ORDER — CEFAZOLIN SODIUM-DEXTROSE 2-4 GM/100ML-% IV SOLN
2.0000 g | INTRAVENOUS | Status: AC
  Administered 2023-06-03: 2 g via INTRAVENOUS

## 2023-06-03 MED ORDER — FENTANYL CITRATE (PF) 100 MCG/2ML IJ SOLN
INTRAMUSCULAR | Status: AC
  Filled 2023-06-03: qty 2

## 2023-06-03 MED ORDER — CHLORHEXIDINE GLUCONATE CLOTH 2 % EX PADS: 6.0000 | MEDICATED_PAD | Freq: Once | CUTANEOUS | Status: DC

## 2023-06-03 MED ORDER — OXYCODONE HCL 5 MG/5ML PO SOLN: 5.0000 mg | Freq: Once | ORAL | Status: AC | PRN

## 2023-06-03 MED ORDER — TRAMADOL HCL 50 MG PO TABS
50.0000 mg | ORAL_TABLET | Freq: Four times a day (QID) | ORAL | 0 refills | Status: DC | PRN
Start: 2023-06-03 — End: 2023-07-01
  Filled 2023-06-03: qty 10, 3d supply, fill #0

## 2023-06-03 MED ORDER — ONDANSETRON HCL 4 MG/2ML IJ SOLN
INTRAMUSCULAR | Status: AC
  Filled 2023-06-03: qty 2

## 2023-06-03 MED ORDER — FENTANYL CITRATE (PF) 100 MCG/2ML IJ SOLN
INTRAMUSCULAR | Status: DC | PRN
  Administered 2023-06-03 (×4): 25 ug via INTRAVENOUS

## 2023-06-03 MED ORDER — DEXAMETHASONE SODIUM PHOSPHATE 10 MG/ML IJ SOLN
INTRAMUSCULAR | Status: AC
  Filled 2023-06-03: qty 1

## 2023-06-03 MED ORDER — ACETAMINOPHEN 500 MG PO TABS
1000.0000 mg | ORAL_TABLET | ORAL | Status: AC
  Administered 2023-06-03: 1000 mg via ORAL

## 2023-06-03 MED ORDER — OXYCODONE HCL 5 MG PO TABS
5.0000 mg | ORAL_TABLET | Freq: Once | ORAL | Status: AC | PRN
  Administered 2023-06-03: 5 mg via ORAL

## 2023-06-03 MED ORDER — DEXAMETHASONE SODIUM PHOSPHATE 10 MG/ML IJ SOLN
INTRAMUSCULAR | Status: DC | PRN
  Administered 2023-06-03: 10 mg via INTRAVENOUS

## 2023-06-03 MED ORDER — LACTATED RINGERS IV SOLN: INTRAVENOUS | Status: DC

## 2023-06-03 MED ORDER — LIDOCAINE 2% (20 MG/ML) 5 ML SYRINGE
INTRAMUSCULAR | Status: AC
  Filled 2023-06-03: qty 5

## 2023-06-03 MED ORDER — OXYCODONE HCL 5 MG PO TABS
ORAL_TABLET | ORAL | Status: AC
  Filled 2023-06-03: qty 1

## 2023-06-03 MED ORDER — PROPOFOL 10 MG/ML IV BOLUS
INTRAVENOUS | Status: AC
  Filled 2023-06-03: qty 20

## 2023-06-03 MED ORDER — LIDOCAINE HCL (CARDIAC) PF 100 MG/5ML IV SOSY
PREFILLED_SYRINGE | INTRAVENOUS | Status: DC | PRN
  Administered 2023-06-03: 60 mg via INTRAVENOUS

## 2023-06-03 MED ORDER — KETOROLAC TROMETHAMINE 15 MG/ML IJ SOLN
INTRAMUSCULAR | Status: AC
  Filled 2023-06-03: qty 1

## 2023-06-03 MED ORDER — CEFAZOLIN SODIUM-DEXTROSE 2-4 GM/100ML-% IV SOLN: 2.0000 g | INTRAVENOUS | Status: AC

## 2023-06-03 MED ORDER — MIDAZOLAM HCL 5 MG/5ML IJ SOLN
INTRAMUSCULAR | Status: DC | PRN
  Administered 2023-06-03: 2 mg via INTRAVENOUS

## 2023-06-03 MED ORDER — FENTANYL CITRATE (PF) 100 MCG/2ML IJ SOLN
25.0000 ug | INTRAMUSCULAR | Status: DC | PRN
  Administered 2023-06-03 (×2): 50 ug via INTRAVENOUS

## 2023-06-03 MED ORDER — ACETAMINOPHEN 500 MG PO TABS
ORAL_TABLET | ORAL | Status: AC
  Filled 2023-06-03: qty 2

## 2023-06-03 MED ORDER — HEMOSTATIC AGENTS (NO CHARGE) OPTIME
TOPICAL | Status: DC | PRN
  Administered 2023-06-03: 1 via TOPICAL

## 2023-06-03 MED ORDER — MIDAZOLAM HCL 2 MG/2ML IJ SOLN
INTRAMUSCULAR | Status: AC
  Filled 2023-06-03: qty 2

## 2023-06-03 MED ORDER — PROPOFOL 10 MG/ML IV BOLUS
INTRAVENOUS | Status: DC | PRN
Start: 2023-06-03 — End: 2023-06-03
  Administered 2023-06-03: 170 mg via INTRAVENOUS

## 2023-06-03 MED ORDER — DROPERIDOL 2.5 MG/ML IJ SOLN: 0.6250 mg | Freq: Once | INTRAMUSCULAR | Status: DC | PRN

## 2023-06-03 MED ORDER — KETOROLAC TROMETHAMINE 15 MG/ML IJ SOLN
15.0000 mg | INTRAMUSCULAR | Status: AC
  Administered 2023-06-03: 15 mg via INTRAVENOUS

## 2023-06-03 MED ORDER — PROPOFOL 500 MG/50ML IV EMUL
INTRAVENOUS | Status: DC | PRN
  Administered 2023-06-03: 150 ug/kg/min via INTRAVENOUS

## 2023-06-03 MED ORDER — ENSURE PRE-SURGERY PO LIQD: 296.0000 mL | Freq: Once | ORAL | Status: DC

## 2023-06-03 MED ORDER — BUPIVACAINE HCL (PF) 0.25 % IJ SOLN
INTRAMUSCULAR | Status: DC | PRN
  Administered 2023-06-03: 20 mL

## 2023-06-03 MED ORDER — ONDANSETRON HCL 4 MG/2ML IJ SOLN
INTRAMUSCULAR | Status: DC | PRN
  Administered 2023-06-03: 4 mg via INTRAVENOUS

## 2023-06-03 MED ORDER — CEFAZOLIN SODIUM-DEXTROSE 2-4 GM/100ML-% IV SOLN
INTRAVENOUS | Status: AC
  Filled 2023-06-03: qty 100

## 2023-06-03 SURGICAL SUPPLY — 56 items
ADH SKN CLS APL DERMABOND .7 (GAUZE/BANDAGES/DRESSINGS) ×2
APL PRP STRL LF DISP 70% ISPRP (MISCELLANEOUS) ×2
APPLIER CLIP 9.375 MED OPEN (MISCELLANEOUS)
APR CLP MED 9.3 20 MLT OPN (MISCELLANEOUS)
BINDER BREAST LRG (GAUZE/BANDAGES/DRESSINGS) IMPLANT
BINDER BREAST MEDIUM (GAUZE/BANDAGES/DRESSINGS) IMPLANT
BINDER BREAST XLRG (GAUZE/BANDAGES/DRESSINGS) IMPLANT
BINDER BREAST XXLRG (GAUZE/BANDAGES/DRESSINGS) IMPLANT
BLADE SURG 15 STRL LF DISP TIS (BLADE) ×2 IMPLANT
BLADE SURG 15 STRL SS (BLADE) ×2
CANISTER SUC SOCK COL 7IN (MISCELLANEOUS) IMPLANT
CANISTER SUCT 1200ML W/VALVE (MISCELLANEOUS) IMPLANT
CHLORAPREP W/TINT 26 (MISCELLANEOUS) ×2 IMPLANT
CLIP APPLIE 9.375 MED OPEN (MISCELLANEOUS) IMPLANT
CLIP TI WIDE RED SMALL 6 (CLIP) IMPLANT
COVER BACK TABLE 60X90IN (DRAPES) ×2 IMPLANT
COVER MAYO STAND STRL (DRAPES) ×2 IMPLANT
COVER PROBE CYLINDRICAL 5X96 (MISCELLANEOUS) ×2 IMPLANT
DERMABOND ADVANCED .7 DNX12 (GAUZE/BANDAGES/DRESSINGS) ×2 IMPLANT
DRAPE LAPAROSCOPIC ABDOMINAL (DRAPES) ×2 IMPLANT
DRAPE UTILITY XL STRL (DRAPES) ×2 IMPLANT
DRSG TEGADERM 4X4.75 (GAUZE/BANDAGES/DRESSINGS) ×2 IMPLANT
ELECT COATED BLADE 2.86 ST (ELECTRODE) ×2 IMPLANT
ELECT REM PT RETURN 9FT ADLT (ELECTROSURGICAL) ×2
ELECTRODE REM PT RTRN 9FT ADLT (ELECTROSURGICAL) ×2 IMPLANT
GAUZE SPONGE 4X4 12PLY STRL LF (GAUZE/BANDAGES/DRESSINGS) ×2 IMPLANT
GLOVE BIO SURGEON STRL SZ7 (GLOVE) ×4 IMPLANT
GLOVE BIOGEL PI IND STRL 7.5 (GLOVE) ×2 IMPLANT
GOWN STRL REUS W/ TWL LRG LVL3 (GOWN DISPOSABLE) ×6 IMPLANT
GOWN STRL REUS W/TWL LRG LVL3 (GOWN DISPOSABLE) ×6
HEMOSTAT ARISTA ABSORB 3G PWDR (HEMOSTASIS) IMPLANT
KIT MARKER MARGIN INK (KITS) ×2 IMPLANT
NDL HYPO 25X1 1.5 SAFETY (NEEDLE) ×2 IMPLANT
NEEDLE HYPO 25X1 1.5 SAFETY (NEEDLE) ×2
NS IRRIG 1000ML POUR BTL (IV SOLUTION) IMPLANT
PACK BASIN DAY SURGERY FS (CUSTOM PROCEDURE TRAY) ×2 IMPLANT
PENCIL SMOKE EVACUATOR (MISCELLANEOUS) ×2 IMPLANT
RETRACTOR ONETRAX LX 90X20 (MISCELLANEOUS) IMPLANT
SLEEVE SCD COMPRESS KNEE MED (STOCKING) ×2 IMPLANT
SPIKE FLUID TRANSFER (MISCELLANEOUS) IMPLANT
SPONGE T-LAP 4X18 ~~LOC~~+RFID (SPONGE) ×2 IMPLANT
STRIP CLOSURE SKIN 1/2X4 (GAUZE/BANDAGES/DRESSINGS) ×2 IMPLANT
SUT MNCRL AB 4-0 PS2 18 (SUTURE) ×2 IMPLANT
SUT MON AB 5-0 PS2 18 (SUTURE) IMPLANT
SUT SILK 2 0 SH (SUTURE) ×2 IMPLANT
SUT VIC AB 2-0 SH 27 (SUTURE) ×4
SUT VIC AB 2-0 SH 27XBRD (SUTURE) ×2 IMPLANT
SUT VIC AB 3-0 SH 27 (SUTURE) ×4
SUT VIC AB 3-0 SH 27X BRD (SUTURE) ×2 IMPLANT
SUT VIC AB 5-0 PS2 18 (SUTURE) IMPLANT
SUT VICRYL AB 3 0 TIES (SUTURE) IMPLANT
SYR CONTROL 10ML LL (SYRINGE) ×2 IMPLANT
TOWEL GREEN STERILE FF (TOWEL DISPOSABLE) ×2 IMPLANT
TRAY FAXITRON CT DISP (TRAY / TRAY PROCEDURE) ×2 IMPLANT
TUBE CONNECTING 20X1/4 (TUBING) IMPLANT
YANKAUER SUCT BULB TIP NO VENT (SUCTIONS) IMPLANT

## 2023-06-03 NOTE — Anesthesia Preprocedure Evaluation (Signed)
Anesthesia Evaluation  Patient identified by MRN, date of birth, ID band Patient awake    Reviewed: Allergy & Precautions, NPO status , Patient's Chart, lab work & pertinent test results  Airway Mallampati: II  TM Distance: >3 FB Neck ROM: Full    Dental no notable dental hx.    Pulmonary neg pulmonary ROS, former smoker   Pulmonary exam normal        Cardiovascular negative cardio ROS  Rhythm:Regular Rate:Normal     Neuro/Psych negative neurological ROS  negative psych ROS   GI/Hepatic negative GI ROS, Neg liver ROS,,,  Endo/Other  negative endocrine ROS    Renal/GU negative Renal ROS  negative genitourinary   Musculoskeletal DCIS left breast   Abdominal Normal abdominal exam  (+)   Peds  Hematology Lab Results      Component                Value               Date                      WBC                      8.0                 05/06/2023                HGB                      13.0                05/06/2023                HCT                      38.5                05/06/2023                MCV                      90.0                05/06/2023                PLT                      249                 05/06/2023              Anesthesia Other Findings   Reproductive/Obstetrics                             Anesthesia Physical Anesthesia Plan  ASA: 2  Anesthesia Plan: General   Post-op Pain Management: Celebrex PO (pre-op)* and Tylenol PO (pre-op)*   Induction: Intravenous  PONV Risk Score and Plan: 3 and Ondansetron, Dexamethasone, Midazolam and Treatment may vary due to age or medical condition  Airway Management Planned: Mask and LMA  Additional Equipment: None  Intra-op Plan:   Post-operative Plan: Extubation in OR  Informed Consent: I have reviewed the patients History and Physical, chart, labs and discussed the procedure including the risks, benefits and  alternatives for the proposed anesthesia with the patient  or authorized representative who has indicated his/her understanding and acceptance.     Dental advisory given  Plan Discussed with: CRNA  Anesthesia Plan Comments:        Anesthesia Quick Evaluation

## 2023-06-03 NOTE — OR Nursing (Signed)
Right breast image sent over via faxitron on accession number ending in [id number].  Radiologist notified.  Left breast image sent via faxitron on requisition ending in [id number].  Radiologist notified.

## 2023-06-03 NOTE — Transfer of Care (Signed)
Immediate Anesthesia Transfer of Care Note  Patient: Tami Howard  Procedure(s) Performed: LEFT BREAST SEED GUIDED LUMPECTOMY (Left: Breast) EXCISION OF BREAST BIOPSY (Right: Breast)  Patient Location: PACU  Anesthesia Type:General  Level of Consciousness: drowsy  Airway & Oxygen Therapy: Patient Spontanous Breathing and Patient connected to face mask oxygen  Post-op Assessment: Report given to RN and Post -op Vital signs reviewed and stable  Post vital signs: Reviewed and stable  Last Vitals:  Vitals Value Taken Time  BP 112/81 06/03/23 1347  Temp    Pulse 75 06/03/23 1349  Resp 17 06/03/23 1349  SpO2 100 % 06/03/23 1349  Vitals shown include unfiled device data.  Last Pain:  Vitals:   06/03/23 1024  TempSrc: Oral  PainSc: 0-No pain         Complications: No notable events documented.

## 2023-06-03 NOTE — Anesthesia Procedure Notes (Signed)
Procedure Name: LMA Insertion Date/Time: 06/03/2023 12:30 PM  Performed by: Lauralyn Primes, CRNAPre-anesthesia Checklist: Patient identified, Emergency Drugs available, Suction available and Patient being monitored Patient Re-evaluated:Patient Re-evaluated prior to induction Oxygen Delivery Method: Circle system utilized Preoxygenation: Pre-oxygenation with 100% oxygen Induction Type: IV induction Ventilation: Mask ventilation without difficulty LMA: LMA inserted LMA Size: 4.0 Number of attempts: 1 Airway Equipment and Method: Bite block Placement Confirmation: positive ETCO2 Tube secured with: Tape Dental Injury: Teeth and Oropharynx as per pre-operative assessment

## 2023-06-03 NOTE — Interval H&P Note (Signed)
History and Physical Interval Note:  06/03/2023 11:59 AM  Tami Howard  has presented today for surgery, with the diagnosis of LEFT BREAST DUCTAL CARCINOMA IN SITU.  The various methods of treatment have been discussed with the patient and family. After consideration of risks, benefits and other options for treatment, the patient has consented to  Procedure(s): LEFT BREAST SEED GUIDED LUMPECTOMY (Left) EXCISION OF BREAST BIOPSY (Right) as a surgical intervention.  The patient's history has been reviewed, patient examined, no change in status, stable for surgery.  I have reviewed the patient's chart and labs.  Questions were answered to the patient's satisfaction.     [name]

## 2023-06-03 NOTE — Discharge Instructions (Addendum)
Post Anesthesia Home Care Instructions  Activity: Get plenty of rest for the remainder of the day. A responsible individual must stay with you for 24 hours following the procedure.  For the next 24 hours, DO NOT: -Drive a car -Advertising copywriter -Drink alcoholic beverages -Take any medication unless instructed by your physician -Make any legal decisions or sign important papers.  Meals: Start with liquid foods such as gelatin or soup. Progress to regular foods as tolerated. Avoid greasy, spicy, heavy foods. If nausea and/or vomiting occur, drink only clear liquids until the nausea and/or vomiting subsides. Call your physician if vomiting continues.  Special Instructions/Symptoms: Your throat may feel dry or sore from the anesthesia or the breathing tube placed in your throat during surgery. If this causes discomfort, gargle with warm salt water. The discomfort should disappear within 24 hours.  If you had a scopolamine patch placed behind your ear for the management of post- operative nausea and/or vomiting:  1. The medication in the patch is effective for 72 hours, after which it should be removed.  Wrap patch in a tissue and discard in the trash. Wash hands thoroughly with soap and water. 2. You may remove the patch earlier than 72 hours if you experience unpleasant side effects which may include dry mouth, dizziness or visual disturbances. 3. Avoid touching the patch. Wash your hands with soap and water after contact with the patch.    Next dose of Tylenol may be taken  at   Next dose of Ibuprofen may be taken at 6p   [employer] Office Phone Number [phone]  POST OP INSTRUCTIONS Take 400 mg of ibuprofen every 8 hours or 650 mg tylenol every 6 hours for next 72 hours then as needed. Use ice several times daily also.  A prescription for pain medication may be given to you upon discharge.  Take your pain medication as prescribed, if needed.  If narcotic pain  medicine is not needed, then you may take acetaminophen (Tylenol), naprosyn (Alleve) or ibuprofen (Advil) as needed. Take your usually prescribed medications unless otherwise directed If you need a refill on your pain medication, please contact your pharmacy.  They will contact our office to request authorization.  Prescriptions will not be filled after 5pm or on week-ends. You should eat very light the first 24 hours after surgery, such as soup, crackers, pudding, etc.  Resume your normal diet the day after surgery. Most patients will experience some swelling and bruising in the breast.  Ice packs and a good support bra will help.  Wear the breast binder provided or a sports bra for 72 hours day and night.  After that wear a sports bra during the day until you return to the office. Swelling and bruising can take several days to resolve.  It is common to experience some constipation if taking pain medication after surgery.  Increasing fluid intake and taking a stool softener will usually help or prevent this problem from occurring.  A mild laxative (Milk of Magnesia or Miralax) should be taken according to package directions if there are no bowel movements after 48 hours. I used skin glue on the incision, you may shower in 24 hours.  The glue will flake off over the next 2-3 weeks.  Any sutures or staples will be removed at the office during your follow-up visit. ACTIVITIES:  You may resume regular daily activities (gradually increasing) beginning the next day.  Wearing a good support bra or sports bra minimizes pain  and swelling.  You may have sexual intercourse when it is comfortable. You may drive when you no longer are taking prescription pain medication, you can comfortably wear a seatbelt, and you can safely maneuver your car and apply brakes. RETURN TO WORK:  ______________________________________________________________________________________ Bonita Quin should see your doctor in the office for a  follow-up appointment approximately two weeks after your surgery.  Your doctor's nurse will typically make your follow-up appointment when she calls you with your pathology report.  Expect your pathology report 3-4 business days after your surgery.  You may call to check if you do not hear from [place] after three days. OTHER INSTRUCTIONS: _______________________________________________________________________________________________ _____________________________________________________________________________________________________________________________________ _____________________________________________________________________________________________________________________________________ _____________________________________________________________________________________________________________________________________  WHEN TO CALL DR WAKEFIELD: Fever over 101.0 Nausea and/or vomiting. Extreme swelling or bruising. Continued bleeding from incision. Increased pain, redness, or drainage from the incision.  The clinic staff is available to answer your questions during regular business hours.  Please don't hesitate to call and ask to speak to one of the nurses for clinical concerns.  If you have a medical emergency, go to the nearest emergency room or call 911.  A surgeon from [facility] is always on call at the hospital.  For further questions, please visit [email or web address]

## 2023-06-03 NOTE — H&P (Addendum)
  45 yof who has no prior breast history presents after screening mm. No mass or dc. She has d density tissue. She left breast calcifications. These are in uoq and measure 5 mm in size. Biopsy was done and this shows grade II DCIS that is er pos at 99 and pr pos at 100. She is here with her husband today. No family history noted.  She has also undergone an MRI that showed artifact a the left sided clip, 2 suspicious nodes, and some right breast masses. US shows at least 1 and possibly 2 abnormal nodes.  There are 2 indeterminate retroareolar masses.  Additional left sided are all benign. Right breast has a complex fa that has been recommended for excision  Review of Systems: A complete review of systems was obtained from the patient. I have reviewed this information and discussed as appropriate with the patient. See HPI as well for other ROS.  Review of Systems  Respiratory: Positive for shortness of breath.  Neurological: Positive for headaches.  All other systems reviewed and are negative.   Medical History: Past Medical History:  Diagnosis Date  Anxiety  History of cancer   There is no problem list on file for this patient.  History reviewed. No pertinent surgical history.   No Known Allergies  No current outpatient medications on file prior to visit.   Family History  Problem Relation Age of Onset  High blood pressure (Hypertension) Mother  Myocardial Infarction (Heart attack) Mother    Social History   Tobacco Use  Smoking Status Every Day  Smokeless Tobacco Current    Social History   Socioeconomic History  Marital status: Single  Tobacco Use  Smoking status: Every Day  Smokeless tobacco: Current   Objective:   Physical Exam Vitals reviewed.  Constitutional:  Appearance: Normal appearance.  Chest:  Breasts: Right: No inverted nipple, mass or nipple discharge.  Left: No inverted nipple, mass or nipple discharge.  Lymphadenopathy:  Upper Body:  Right  upper body: No supraclavicular or axillary adenopathy.  Left upper body: No supraclavicular or axillary adenopathy.  Neurological:  Mental Status: She is alert.    Assessment and Plan:   Diagnoses and all orders for this visit:  Ductal carcinoma in situ (DCIS) of left breast  left breast seed guided lumpectomy, right breast seed guided excisional biopsy  We discussed the staging and pathophysiology of breast cancer. We discussed noninvasive nature of DCIS.  She does not need a sn biopsy.   We discussed the options for treatment of the breast cancer which included lumpectomy versus a mastectomy. We discussed the performance of the lumpectomy with radioactive seed placement. We discussed a 5-10% chance of a positive margin requiring reexcision in the operating room. We also discussed that she will likely need radiation therapy if she undergoes lumpectomy. We discussed mastectomy and the postoperative care for that as well. Mastectomy can be followed by reconstruction. The decision for lumpectomy vs mastectomy has no impact on decision for chemotherapy. Most mastectomy patients will not need radiation therapy. We discussed that there is no difference in her survival whether she undergoes lumpectomy with radiation therapy or antiestrogen therapy versus a mastectomy. There is also no real difference between her recurrence in the breast.  We discussed the risks of operation including bleeding, infection, possible reoperation. She understands her further therapy will be based on what her stages at the time of her operation.

## 2023-06-03 NOTE — Op Note (Signed)
Preoperative diagnosis: #1 left breast ductal carcinoma in situ 2.  Right breast complex fibroadenoma Postoperative diagnosis: Same as above Procedure: 1.  Left breast radioactive seed guided lumpectomy 2.  Right breast radioactive seed guided excisional biopsy Surgeon: Dr. Harden Mo Anesthesia: General Complications: None Drains: Specimens: 1.  Right breast mass containing seed and clip 2.  Left breast seed guided lumpectomy marked with paint containing seed and clip 3.  Additional superior and posterior margins marked short superior, long lateral, double deep Sponge needle count was correct completion Disposition recovery stable condition  Indications: This a 42 year old female who presented after screening mammogram.  She has D density tissue.  She had 5 mm of calcifications in her left upper outer quadrant.  This is grade 2 DCIS that is ER/PR positive.  She also went an MRI that showed some areas on the right side.  She underwent additional biopsies on both sides all of which were benign.  She was recommended that the complex fibroadenoma be excised though.  Procedure: After informed consent was obtained she was taken to the operating room.  She had seeds placed bilaterally.  Antibiotics were given.  SCDs were in place.  She was placed under general anesthesia without complication.  She was prepped and draped in a standard sterile surgical fashion.  Surgical timeout was then performed.  I have infiltrated Marcaine in the right breast.  I then made a periareolar incision in order to hide the scar.  I then used the neoprobe to remove the seed in the surrounding tissue there was a fairly significant hematoma at the site as well.  I then did a mammogram which confirmed removal of the seed and clip.  This was marked with paint.  I then obtained hemostasis in the cavity.  I did place some Arista due to the hematoma and just some what was more than likely just old blood present.  I then closed  this with 2-0 Vicryl, 3-0 Vicryl, and 5-0 Monocryl.  Glue and Steri-Strips were applied.  I turned my attention to the left side.  This was in the upper outer quadrant.  This was little bit closer to the skin and I elected to make a curvilinear incision overlying the seed.  I then dissected to the seed and remove the seed in the surrounding tissue.  Mammogram confirmed removal of the clip and the seed.  I did take a couple additional margins as I thought I might be close.  I then placed clips in the cavity.  I obtained hemostasis.  I closed the tissue with 2-0 Vicryl.  I closed the skin with 3-0 Vicryl and 4-0 Monocryl.  Placed glue and Steri-Strips.  She tolerated this well was extubated and transferred to recovery stable.

## 2023-06-04 ENCOUNTER — Encounter (HOSPITAL_BASED_OUTPATIENT_CLINIC_OR_DEPARTMENT_OTHER): Payer: Self-pay | Admitting: General Surgery

## 2023-06-04 NOTE — Anesthesia Postprocedure Evaluation (Signed)
Anesthesia Post Note  Patient: Tami Howard  Procedure(s) Performed: LEFT BREAST SEED GUIDED LUMPECTOMY (Left: Breast) EXCISION OF BREAST BIOPSY (Right: Breast)     Patient location during evaluation: PACU Anesthesia Type: General Level of consciousness: awake and alert Pain management: pain level controlled Vital Signs Assessment: post-procedure vital signs reviewed and stable Respiratory status: spontaneous breathing, nonlabored ventilation, respiratory function stable and patient connected to nasal cannula oxygen Cardiovascular status: blood pressure returned to baseline and stable Postop Assessment: no apparent nausea or vomiting Anesthetic complications: no   No notable events documented.  Last Vitals:  Vitals:   06/03/23 1430 06/03/23 1511  BP:  103/85  Pulse: 81 74  Resp: 19 20  Temp:  36.6 C  SpO2: 95% 99%    Last Pain:  Vitals:   06/04/23 0847  TempSrc:   PainSc: 0-No pain                 Earl Lites P Devere Brem

## 2023-06-05 LAB — SURGICAL PATHOLOGY

## 2023-06-08 ENCOUNTER — Encounter: Payer: Self-pay | Admitting: General Practice

## 2023-06-08 NOTE — Progress Notes (Signed)
[facility] Spiritual Care Note  Followed up postoperatively by phone to check in with Tami Howard. She reports that she is doing well overall, with manageable pain and motivation to register for [employer] [facility] support programming. Her mom is still visiting for support. [name] knows to reach out to chaplain whenever needed/desired.   [address] [place], [place], [facility] Pager [phone] Voicemail [phone]

## 2023-06-09 ENCOUNTER — Encounter: Payer: Self-pay | Admitting: *Deleted

## 2023-06-10 ENCOUNTER — Telehealth: Payer: Self-pay | Admitting: Radiation Oncology

## 2023-06-10 NOTE — Telephone Encounter (Signed)
8/21 @ 11:19 am Patient called to see if Dr. Doreen Salvage team has spoken to Dr. Mitzi Hansen yet about that she may not need radiation treatments after surgery.  She was wondering do she need to cancel all her appointments at this time.  Email sent to Laurence Aly and copied Rober Minion so they are aware.

## 2023-06-11 IMAGING — MG MM DIGITAL SCREENING BILAT W/ TOMO AND CAD
8 series · 8 of 24 positions shown · non-contrast
Comparison: None.

CLINICAL DATA: Screening.

EXAM:
DIGITAL SCREENING BILATERAL MAMMOGRAM WITH TOMOSYNTHESIS AND CAD
TECHNIQUE: Bilateral screening digital craniocaudal and mediolateral oblique
mammograms were obtained. Bilateral screening digital breast
tomosynthesis was performed. The images were evaluated with
computer-aided detection.

[R CC synth-2D]
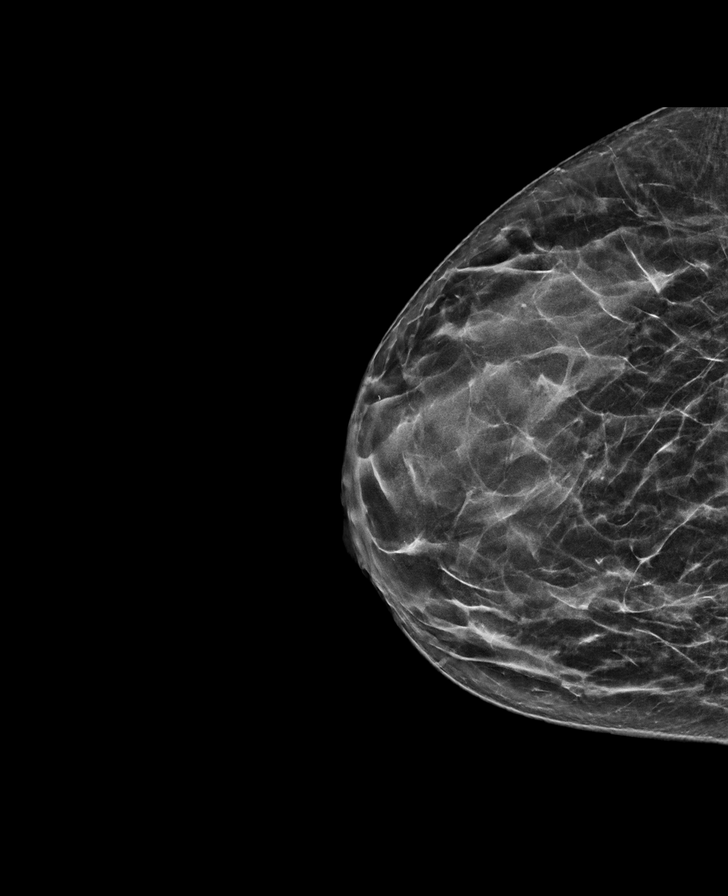

[R MLO synth-2D]
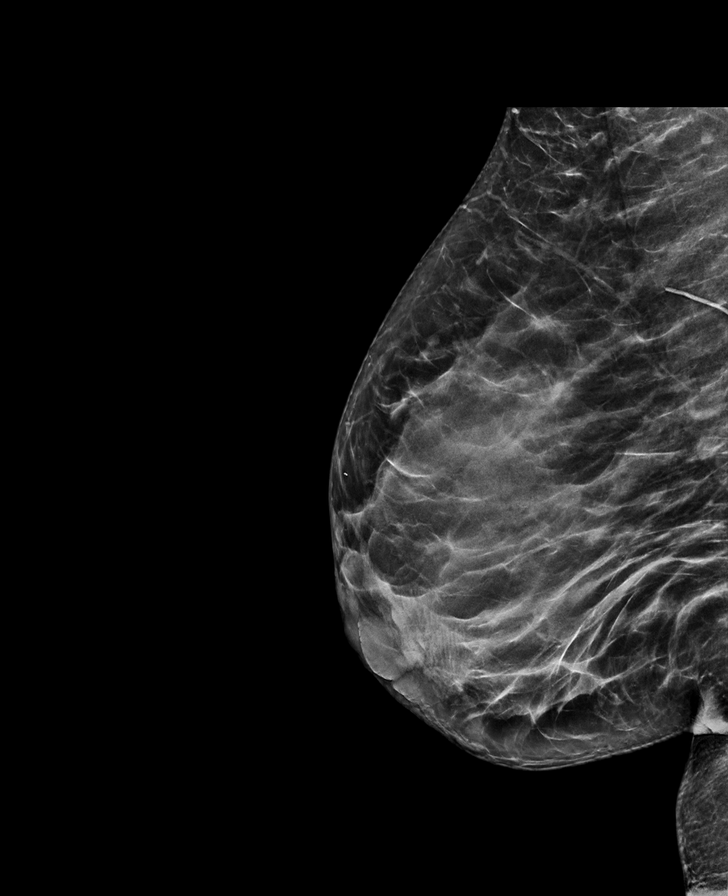

[L CC synth-2D]
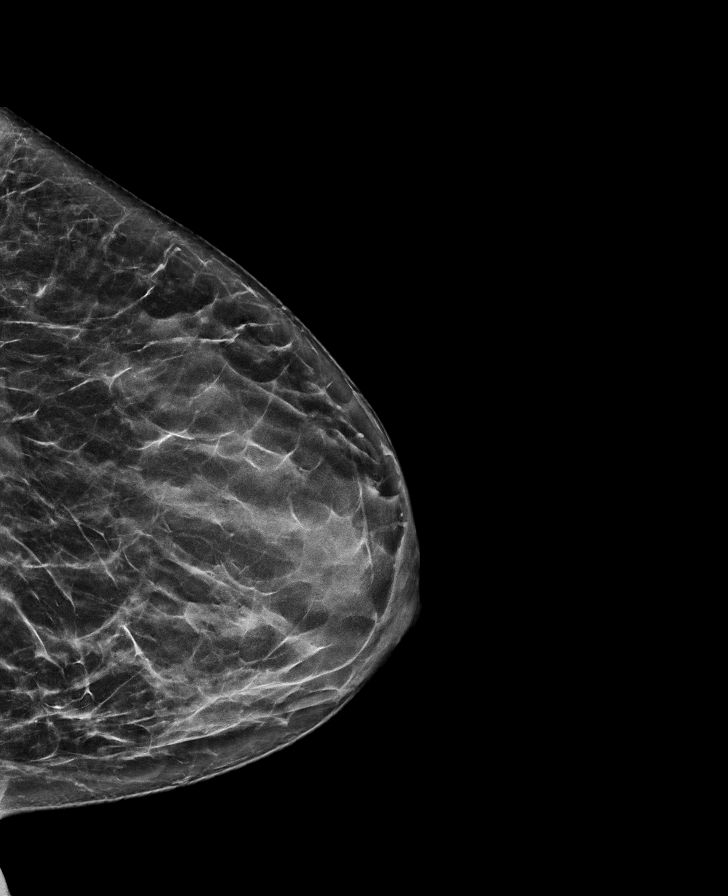

[L MLO synth-2D]
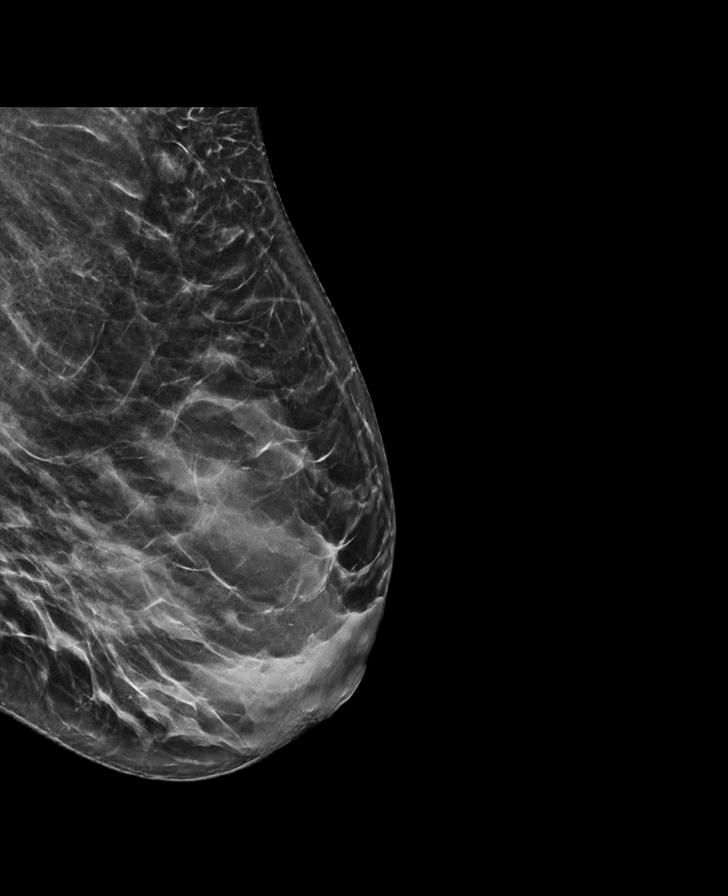

[R CC tomo · tomo slice 32/63.0]
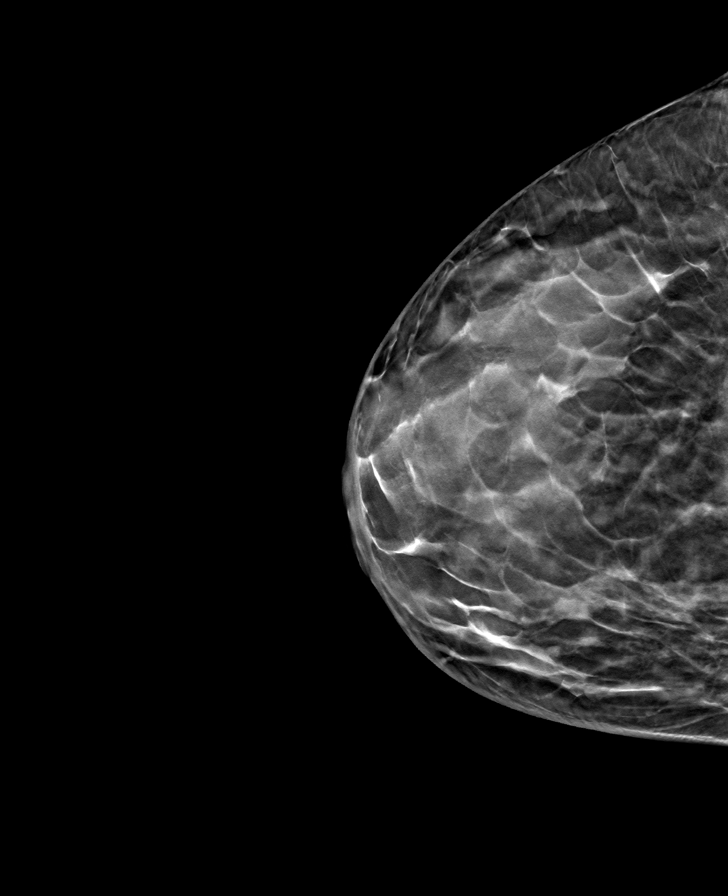

[L MLO tomo · tomo slice 35/68.0]
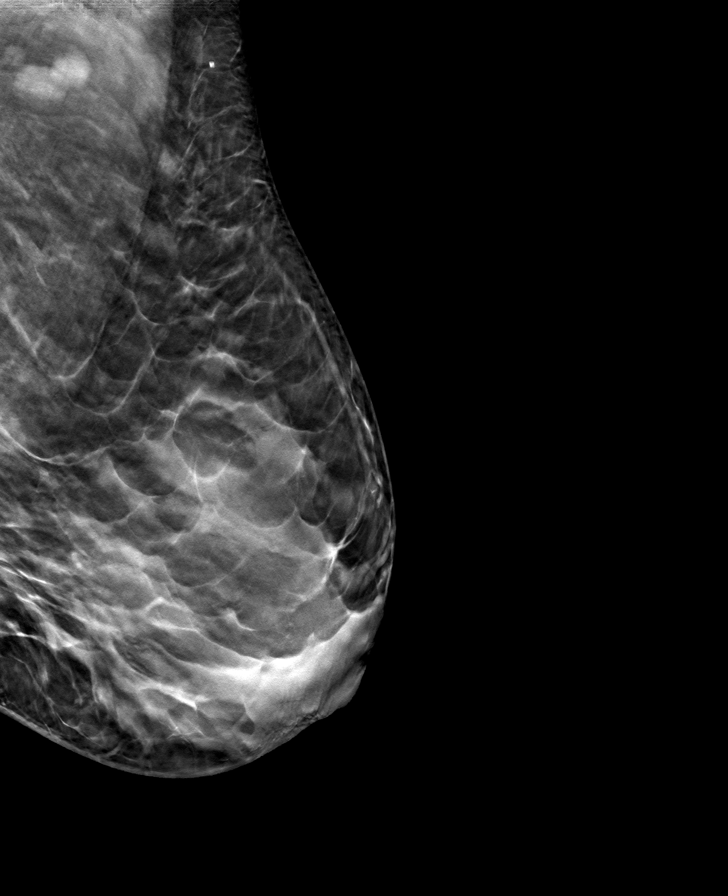

[R MLO tomo · tomo slice 33/66.0]
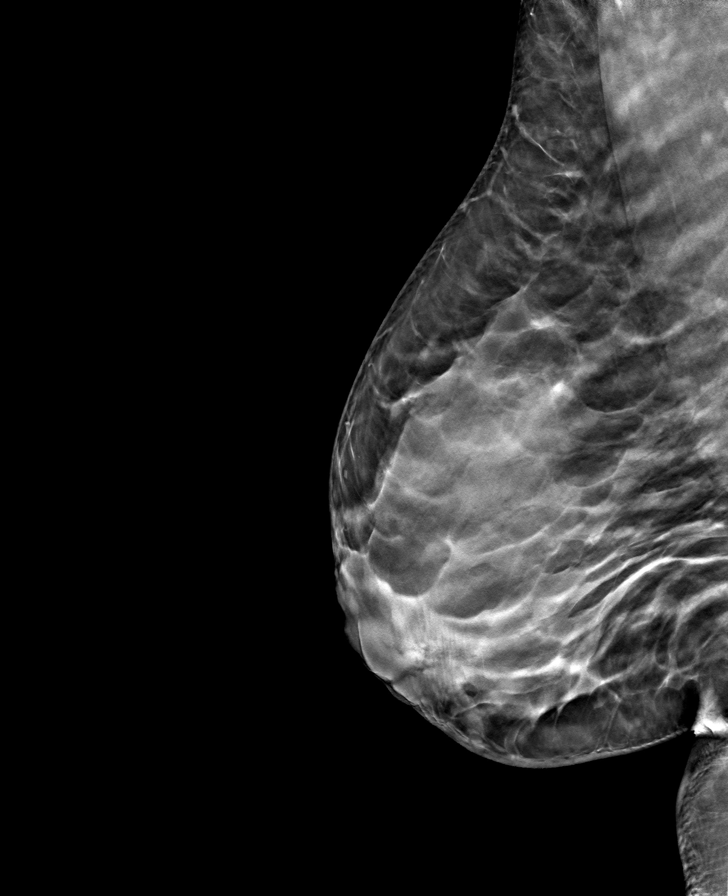

[L CC tomo · tomo slice 34/67.0]
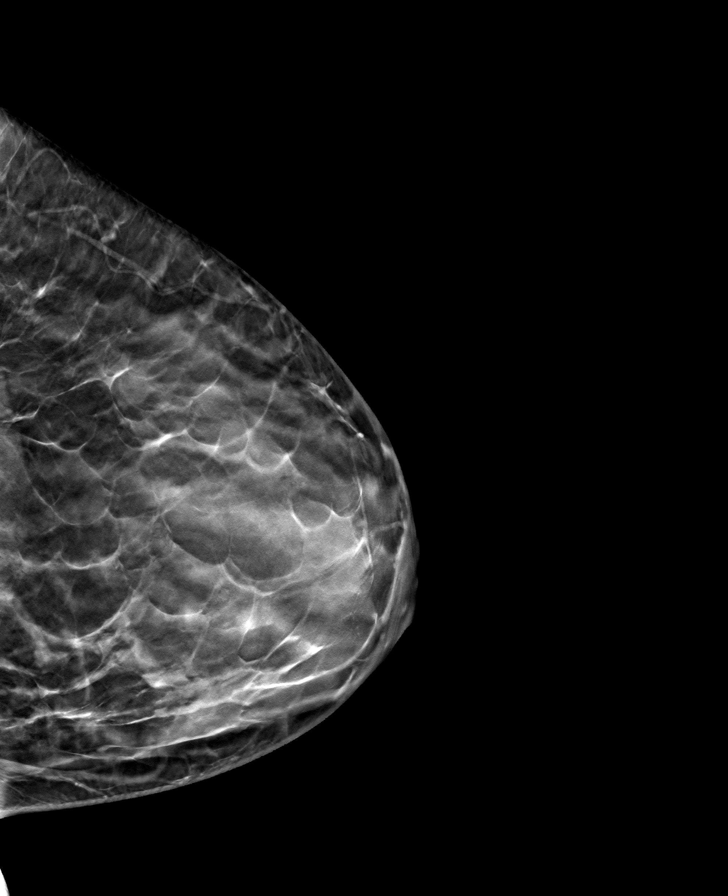

[8 of 24 positions shown; findings below may reference images not displayed]

ACR Breast Density Category d: The breast tissue is extremely dense,
which lowers the sensitivity of mammography.
FINDINGS: There are no findings suspicious for malignancy.
IMPRESSION: No mammographic evidence of malignancy. A result letter of this
screening mammogram will be mailed directly to the patient.

RECOMMENDATION:
Screening mammogram in one year. (Code:W3-Z-XIN)

BI-RADS CATEGORY  1: Negative.

## 2023-06-15 ENCOUNTER — Encounter: Payer: Self-pay | Admitting: *Deleted

## 2023-06-19 ENCOUNTER — Other Ambulatory Visit: Payer: Medicaid Other

## 2023-06-21 DIAGNOSIS — Z419 Encounter for procedure for purposes other than remedying health state, unspecified: Secondary | ICD-10-CM | POA: Diagnosis not present

## 2023-06-25 NOTE — Progress Notes (Signed)
Attestation signed by Dorothy Puffer, MD at 05/07/2023  2:33 PM   Please see the note from Laurence Aly, PA-C from today's visit for more details of today's encounter.  I have personally performed a face to face diagnostic evaluation on this patient and devised the following assessment and plan.   The patient was seen today in clinic preoperatively for her diagnosis of DCIS of the left breast, grade 2. The patient appears to be a good candidate for breast conservation treatment. We discussed the recommendation to subsequently proceed with adjuvant radiation treatment approximately 4-6 weeks after surgery. We discussed the potential benefit of treatment as well as possible side effects and risks. All of the patient's questions were answered. I look forward to seeing the patient at the postoperatively to further coordinate her care. I would anticipate treating the patient with tangent fields to the left breast radiation treatment fields for 4 - 6 1/2 weeks.   Dorothy Puffer, MD           Expand All Collapse All  Radiation Oncology         [phone] ________________________________   Name: Tami Howard            MRN: [id number]  Date of Service: 05/06/2023 DOB: [birth year]   YQ:MVHQION, [name], NP  [name], MD      REFERRING PHYSICIAN: [name], MD     DIAGNOSIS: The encounter diagnosis was Ductal carcinoma in situ (DCIS) of left breast.     HISTORY OF PRESENT ILLNESS: Tami Howard is a 42 y.o. female seen in the multidisciplinary breast clinic for a new diagnosis of left breast cancer. She presented with screening detected mass in the left breast and further diagnostic workup identified a mass in the 11 o'clock position that was hypoechoic measuring 1.3 cm there is 1 abnormal appearing lymph node with focal thickening measuring 1.2 cm in the left axilla.  She underwent biopsies on 04/27/2023, her breast biopsy showed intermediate grade DCIS. She is seen today to  discuss treatment recommendations of her cancer.       PREVIOUS RADIATION THERAPY: No     PAST MEDICAL HISTORY:  No past medical history on file.           PAST SURGICAL HISTORY:      Past Surgical History:  Procedure Laterality Date   BREAST BIOPSY Left 04/27/2023    MM LT BREAST BX W LOC DEV 1ST LESION IMAGE BX SPEC STEREO GUIDE 04/27/2023 [facility]   DIAGNOSTIC LAPAROSCOPY WITH REMOVAL OF ECTOPIC PREGNANCY Left      pt believes on left side, was able to successfully have children after procedure   ROTATOR CUFF REPAIR                FAMILY HISTORY:       Family History  Problem Relation Age of Onset   Breast cancer Other              SOCIAL HISTORY:  reports that she quit smoking about 6 months ago. Her smoking use included cigarettes. She started smoking about 26 years ago. She has never used smokeless tobacco. She reports current alcohol use. She reports that she does not currently use drugs after having used the following drugs: Marijuana. The patient is married and lives in [place]. 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 She is accompanied by her husband, and her mother and adult son are remotely joining as well on FaceTime.  ALLERGIES: Patient has no known allergies.     MEDICATIONS:        Current Outpatient Medications  Medication Sig Dispense Refill   Multiple Vitamins-Minerals (MULTIPLE VITAMINS/WOMENS PO) Take by mouth.          No current facility-administered medications for this encounter.          REVIEW OF SYSTEMS: On review of systems, the patient reports that she is doing well. She's been nervous about her diagnosis and unsure what therapy she would need, but feels more at ease after hearing discussion thusfar. No breast specific complaints are noted.        PHYSICAL EXAM:     Wt Readings from Last 3 Encounters:  05/06/23 172 lb 9.6 oz (78.3 kg)  02/25/23 170 lb 6.4 oz (77.3 kg)  02/06/23 170 lb 3.2 oz (77.2 kg)        Temp Readings from Last 3 Encounters:  05/06/23 98.1 F (36.7 C) (Tympanic)  08/27/22 98 F (36.7 C) (Temporal)  06/11/22 97.9 F (36.6 C) (Oral)       BP Readings from Last 3 Encounters:  05/06/23 121/82  02/25/23 110/76  02/06/23 108/79       Pulse Readings from Last 3 Encounters:  05/06/23 83  02/25/23 78  02/06/23 79      In general this is a well appearing African American female in no acute distress. She's alert and oriented x4 and appropriate throughout the examination. Cardiopulmonary assessment is negative for acute distress and she exhibits normal effort. Bilateral breast exam is deferred.       ECOG = 1   0 - Asymptomatic (Fully active, able to carry on all predisease activities without restriction)   1 - Symptomatic but completely ambulatory (Restricted in physically strenuous activity but ambulatory and able to carry out work of a light or sedentary nature. For example, light housework, office work)   2 - Symptomatic, <50% in bed during the day (Ambulatory and capable of all self care but unable to carry out any work activities. Up and about more than 50% of waking hours)   3 - Symptomatic, >50% in bed, but not bedbound (Capable of only limited self-care, confined to bed or chair 50% or more of waking hours)   4 - Bedbound (Completely disabled. Cannot carry on any self-care. Totally confined to bed or chair)   5 - Death    Santiago Glad MM, Creech RH, Tormey DC, et al. [phone]). "Toxicity and response criteria of the [facility]". Am. Evlyn Clines. Oncol. 5 (6): 649-55       LABORATORY DATA:  Recent Labs       Lab Results  Component Value Date    WBC 8.0 05/06/2023    HGB 13.0 05/06/2023    HCT 38.5 05/06/2023    MCV 90.0 05/06/2023    PLT 249 05/06/2023      Recent Labs       Lab Results  Component Value Date    NA 139 05/06/2023    K 3.7 05/06/2023    CL 108 05/06/2023    CO2 23 05/06/2023      Recent Labs       Lab  Results  Component Value Date    ALT 14 05/06/2023    AST 14 (L) 05/06/2023    ALKPHOS 51 05/06/2023    BILITOT 0.4 05/06/2023          RADIOGRAPHY:  Imaging Results  MM LT BREAST BX  W LOC DEV 1ST LESION IMAGE BX SPEC STEREO GUIDE   Addendum Date: 04/29/2023   ADDENDUM REPORT: 04/29/2023 15:50 ADDENDUM: Pathology revealed DUCTAL CARCINOMA IN SITU, PREDOMINANTLY CRIBRIFORM TYPE WITH FOCAL NECROSIS AND ASSOCIATED CALCIFICATIONS, NUCLEAR GRADE 2 OF 3, NECROSIS: FOCALLY PRESENT, CALCIFICATIONS: PRESENT of the LEFT breast, upper outer, (coil clip). This was found to be concordant by Dr. Emmaline Kluver. Pathology results were discussed with the patient by telephone. The patient reported doing well after the biopsy with tenderness at the site. Post biopsy instructions and care were reviewed and questions were answered. The patient was encouraged to call [facility] for any additional concerns. My direct phone number was provided. The patient was referred to [facility] on May 06, 2023. Pathology results reported by Rene Kocher, RN on 04/28/2023. Electronically Signed   By: Emmaline Kluver M.D.   On: 04/29/2023 15:50    Result Date: 04/29/2023 CLINICAL DATA:  42 year old female presenting for biopsy of calcifications in the left breast. EXAM: LEFT BREAST STEREOTACTIC CORE NEEDLE BIOPSY COMPARISON:  Previous exam(s). FINDINGS: The patient and I discussed the procedure of stereotactic-guided biopsy including benefits and alternatives. We discussed the high likelihood of a successful procedure. We discussed the risks of the procedure including infection, bleeding, tissue injury, clip migration, and inadequate sampling. Informed written consent was given. The usual time out protocol was performed immediately prior to the procedure. Using sterile technique and 1% Lidocaine as local anesthetic, under  stereotactic guidance, a 9 gauge vacuum assisted device was used to perform core needle biopsy of calcifications in the upper outer left breast using a superior approach. Specimen radiograph was performed showing 1 specimen with calcifications. Specimens with calcifications are identified for pathology. Lesion quadrant: Upper outer quadrant At the conclusion of the procedure, a coil shaped tissue marker clip was deployed into the biopsy cavity. Follow-up 2-view mammogram was performed and dictated separately. IMPRESSION: Stereotactic-guided biopsy of calcifications in the upper outer left breast. No apparent complications. Electronically Signed: By: Emmaline Kluver M.D. On: 04/27/2023 12:18    MM CLIP PLACEMENT LEFT   Result Date: 04/27/2023 CLINICAL DATA:  Post procedure mammogram for clip placement EXAM: 3D DIAGNOSTIC LEFT MAMMOGRAM POST STEREOTACTIC BIOPSY COMPARISON:  Previous exam(s). FINDINGS: 3D Mammographic images were obtained following stereotactic guided biopsy of calcifications in the upper outer left breast. The biopsy marking clip is in expected position at the site of biopsy. IMPRESSION: Appropriate positioning of the coil shaped biopsy marking clip at the site of biopsy in the upper outer left breast. Final Assessment: Post Procedure Mammograms for Marker Placement Electronically Signed   By: Emmaline Kluver M.D.   On: 04/27/2023 12:17   MM Digital Diagnostic Unilat L   Result Date: 04/08/2023 CLINICAL DATA:  Patient returns after screening for evaluation of LEFT breast calcifications. EXAM: DIGITAL DIAGNOSTIC UNILATERAL LEFT MAMMOGRAM WITH CAD TECHNIQUE: Left digital diagnostic mammography was performed. COMPARISON:  Previous exam(s). ACR Breast Density Category d: The breasts are extremely dense, which lowers the sensitivity of mammography. FINDINGS: Magnified views are performed of calcifications in the UPPER-OUTER QUADRANT of the LEFT breast. These views demonstrate layering  calcifications spanning 5 millimeters. Calcifications have no suspicious morphology or distribution. IMPRESSION: Probably benign calcifications in the LEFT breast, likely fibrocystic changes. We discussed management options including biopsy and close follow-up. Imaging followup is recommended at 6, 12, and 24 months to assess stability. The patient concurs with this plan. RECOMMENDATION:  Recommend LEFT diagnostic mammogram in 6 months. I have discussed the findings and recommendations with the patient. If applicable, a reminder letter will be sent to the patient regarding the next appointment. BI-RADS CATEGORY  3: Probably benign. Electronically Signed   By: Norva Pavlov M.D.   On: 04/08/2023 11:54         IMPRESSION/PLAN: 1.         Intermediate grade, ER/PR positive DCIS of the left breast. Dr. Mitzi Hansen discusses the pathology findings and reviews the nature of noninvasive left breast disease. The consensus from the breast conference includes breast conservation with lumpectomy. Dr. Mitzi Hansen recommends external radiotherapy to the breast  to reduce risks of local recurrence followed by antiestrogen therapy. We discussed the risks, benefits, short, and long term effects of radiotherapy, as well as the curative intent, and the patient is interested in proceeding. Dr. Mitzi Hansen discusses the delivery and logistics of radiotherapy and anticipates a course of 6 1/2 weeks of radiotherapy to the left breast with deep inspiration breath hold technique, though she is aware of the possibility that insurance may only cover a hypofractionacted course, though with her young age, Dr. Mitzi Hansen prefers the 6 1/2 week regimen with decades of outcome data for a patient who should anticipate multiple decades of life ahead. We will see her back a few weeks after surgery to discuss the simulation process and anticipate we starting radiotherapy about 4-6 weeks after surgery.  2.Possible genetic predisposition to malignancy. The patient is  a candidate for genetic testing given her personal  history. She will meet with our geneticist today in clinic. 3.Contraceptive Counseling. The patient has an IUD and is aware of the need for a negative home pregnancy test prior to radiation simulation and treatment. She is counseled on the use of condoms with her partner during therapy as well.    In a visit lasting 60 minutes, greater than 50% of the time was spent face to face reviewing her case, as well as in preparation of, discussing, and coordinating the patient's care.   The above documentation reflects my direct findings during this shared patient visit. Please see the separate note by Dr. Mitzi Hansen on this date for the remainder of the patient's plan of care.      Osker Mason, [facility]       **Disclaimer: This note was dictated with voice recognition software. Similar sounding words can inadvertently be transcribed and this note may contain transcription errors which may not have been corrected upon publication of note.**    Cosigned by: Dorothy Puffer, MD at 05/07/2023  2:33 PM   Revision History

## 2023-06-25 NOTE — Addendum Note (Signed)
Encounter addended by: Ronny Bacon, PA-C on: 06/25/2023 4:01 PM  Actions taken: Clinical Note Signed

## 2023-06-25 NOTE — Addendum Note (Signed)
Encounter addended by: Ronny Bacon, PA-C on: 06/25/2023 4:01 PM  Actions taken: Delete clinical note

## 2023-06-29 NOTE — Progress Notes (Incomplete)
Radiation Oncology         [phone] ________________________________  Name: Tami Howard        MRN: [id number]  Date of Service: 07/01/2023 DOB: 01/31/1981  QM:[id number], Tami Stakes, NP  [name], MD     REFERRING PHYSICIAN: [name], MD   DIAGNOSIS: The encounter diagnosis was Ductal carcinoma in situ (DCIS) of left breast.   HISTORY OF PRESENT ILLNESS: Tami Howard is a 42 y.o. female with a history of left breast cancer. She presented with screening detected mass in the left breast and further diagnostic workup identified a mass in the 11 o'clock position that was hypoechoic measuring 1.3 cm there is 1 abnormal appearing lymph node with focal thickening measuring 1.2 cm in the left axilla.  She underwent biopsies on 04/27/2023, her breast biopsy showed intermediate grade DCIS.   Since her last visit, she did Since her last visit she did undergo an MRI of the breasts on 05/15/2023 which showed artifact, postbiopsy marking clip in the upper outer left breast consistent with the biopsy-proven site of DCIS.  2 suspicious morphologically abnormal lymph nodes in the left axilla were noted and it was recommended that she have a second look ultrasound and biopsy.  There were also 2 indeterminate 9 to 10 mm enhancing masses in the right breast and Second Look ultrasound was also recommended.  Repeat ultrasound on 05/26/2023 showed 1-2 abnormal appearing left axillary nodes and 2 indeterminate retroareolar right breast masses without right adenopathy.  Biopsies of the right breast at 9:00 and 330 both were benign consistent with fibroadenomatous change in the 9:00 specimen and fibrocystic change in the 3:30 specimen.  An additional biopsy on 05/28/2023 of the right breast showed fibroadenoma and the finding in the axilla was felt to be cystic in nature and went on to have aspiration as well.  She underwent left lumpectomy on 06/03/23 and this was done for both sides.  The right breast  lumpectomy showed fibroadenoma with intraductal papilloma without evidence of malignancy.  Left site showed no residual DCIS but fibrocystic changes additional posterior and superior margin excisions also were benign with fibrocystic change in the posterior margin.  Given these findings she is seen to discuss radiotherapy.   PREVIOUS RADIATION THERAPY: No   PAST MEDICAL HISTORY: No past medical history on file.     PAST SURGICAL HISTORY: Past Surgical History:  Procedure Laterality Date   BREAST BIOPSY Left 04/27/2023   MM LT BREAST BX W LOC DEV 1ST LESION IMAGE BX SPEC STEREO GUIDE 04/27/2023 [facility]   BREAST BIOPSY Right 05/26/2023   [place] RT BREAST BX W LOC DEV 1ST LESION IMG BX SPEC US GUIDE 05/26/2023 [facility]   BREAST BIOPSY Right 05/26/2023   [place] RT BREAST BX W LOC DEV EA ADD LESION IMG BX SPEC US GUIDE 05/26/2023 [facility]   BREAST BIOPSY  06/02/2023   MM LT RADIOACTIVE SEED LOC MAMMO GUIDE 06/02/2023 [facility]   BREAST BIOPSY  06/03/2023   MM RT RADIOACTIVE SEED LOC MAMMO GUIDE 06/03/2023 [facility]   BREAST LUMPECTOMY WITH RADIOACTIVE SEED LOCALIZATION Left 06/03/2023   Procedure: LEFT BREAST SEED GUIDED LUMPECTOMY;  Surgeon: [name], MD;  Location: [facility];  Service: General;  Laterality: Left;   DIAGNOSTIC LAPAROSCOPY WITH REMOVAL OF ECTOPIC PREGNANCY Left    pt believes on left side, was able to successfully have children after procedure   EXCISION OF BREAST BIOPSY Right 06/03/2023   Procedure: EXCISION OF BREAST  BIOPSY;  Surgeon: [name], MD;  Location: [facility];  Service: General;  Laterality: Right;   ROTATOR CUFF REPAIR       FAMILY HISTORY:  Family History  Problem Relation Age of Onset   Kidney cancer Mother 31   Liver cancer Maternal Grandmother    Liver cancer Maternal Grandfather    Breast cancer Paternal Grandmother 21   Breast cancer Other      SOCIAL HISTORY:   reports that she quit smoking about 8 months ago. Her smoking use included cigarettes. She started smoking about 26 years ago. She has never used smokeless tobacco. She reports current alcohol use. She reports that she does not currently use drugs after having used the following drugs: Marijuana. The patient is married and lives in [place]. 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    ALLERGIES: Patient has no known allergies.   MEDICATIONS:  Current Outpatient Medications  Medication Sig Dispense Refill   Multiple Vitamins-Minerals (MULTIPLE VITAMINS/WOMENS PO) Take by mouth.     traMADol (ULTRAM) 50 MG tablet Take 1 tablet (50 mg total) by mouth every 6 (six) hours as needed. 10 tablet 0   No current facility-administered medications for this visit.     REVIEW OF SYSTEMS: On review of systems, the patient reports that she is doing ***     PHYSICAL EXAM:  Wt Readings from Last 3 Encounters:  06/03/23 179 lb 14.3 oz (81.6 kg)  05/06/23 172 lb 9.6 oz (78.3 kg)  02/25/23 170 lb 6.4 oz (77.3 kg)   Temp Readings from Last 3 Encounters:  06/03/23 97.9 F (36.6 C)  05/06/23 98.1 F (36.7 C) (Tympanic)  08/27/22 98 F (36.7 C) (Temporal)   BP Readings from Last 3 Encounters:  06/03/23 103/85  05/06/23 121/82  02/25/23 110/76   Pulse Readings from Last 3 Encounters:  06/03/23 74  05/06/23 83  02/25/23 78    In general this is a well appearing *** female in no acute distress. She's alert and oriented x4 and appropriate throughout the examination. Cardiopulmonary assessment is negative for acute distress and she exhibits normal effort. Bilateral breast exam is deferred.    ECOG = ***  0 - Asymptomatic (Fully active, able to carry on all predisease activities without restriction)  1 - Symptomatic but completely ambulatory (Restricted in physically strenuous activity but ambulatory and able to carry out work of a light or sedentary nature. For example, light housework,  office work)  2 - Symptomatic, <50% in bed during the day (Ambulatory and capable of all self care but unable to carry out any work activities. Up and about more than 50% of waking hours)  3 - Symptomatic, >50% in bed, but not bedbound (Capable of only limited self-care, confined to bed or chair 50% or more of waking hours)  4 - Bedbound (Completely disabled. Cannot carry on any self-care. Totally confined to bed or chair)  5 - Death   Santiago Glad MM, Creech RH, Tormey DC, et al. [phone]). "Toxicity and response criteria of the [facility]". Am. Evlyn Clines. Oncol. 5 (6): 649-55    LABORATORY DATA:  Lab Results  Component Value Date   WBC 8.0 05/06/2023   HGB 13.0 05/06/2023   HCT 38.5 05/06/2023   MCV 90.0 05/06/2023   PLT 249 05/06/2023   Lab Results  Component Value Date   NA 139 05/06/2023   K 3.7 05/06/2023   CL 108 05/06/2023   CO2  23 05/06/2023   Lab Results  Component Value Date   ALT 14 05/06/2023   AST 14 (L) 05/06/2023   ALKPHOS 51 05/06/2023   BILITOT 0.4 05/06/2023      RADIOGRAPHY: MM BREAST SURGICAL SPECIMEN  Result Date: 06/03/2023 CLINICAL DATA:  Evaluate surgical specimen following lumpectomy for LEFT breast cancer. EXAM: SPECIMEN RADIOGRAPH OF THE LEFT BREAST COMPARISON:  Previous exam(s). FINDINGS: Status post excision of the LEFT breast. The radioactive seed and COIL biopsy marker clip are present and intact. IMPRESSION: Specimen radiograph of the LEFT breast. Electronically Signed   By: Harmon Pier M.D.   On: 06/03/2023 13:22   MM Breast Surgical Specimen  Result Date: 06/03/2023 CLINICAL DATA:  Evaluate surgical specimen following excision of RIGHT breast fibroadenoma. EXAM: SPECIMEN RADIOGRAPH OF THE RIGHT BREAST COMPARISON:  Previous exam(s). FINDINGS: Status post excision of the RIGHT breast. The radioactive seed and spiral biopsy marker clip are present within the specimen. IMPRESSION: Specimen radiograph of the RIGHT breast.  Electronically Signed   By: Harmon Pier M.D.   On: 06/03/2023 13:06   MM RT RADIOACTIVE SEED LOC MAMMO GUIDE  Result Date: 06/03/2023 CLINICAL DATA:  42 year old with biopsy-proven invasive ductal carcinoma involving the UPPER OUTER QUADRANT of the LEFT breast for which radioactive seed localization was performed yesterday. 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 EXAM: MAMMOGRAPHIC GUIDED RADIOACTIVE SEED LOCALIZATION OF THE RIGHT BREAST COMPARISON:  Previous exam(s). FINDINGS: Patient presents for radioactive seed localization prior to RIGHT breast excisional biopsy. I met with the patient and we discussed the procedure of seed localization including benefits and alternatives. We discussed the high likelihood of a successful procedure. We discussed the risks of the procedure including infection, bleeding, tissue injury and further surgery. We discussed the low dose of radioactivity involved in the procedure. Informed, written consent was given. The usual time-out protocol was performed immediately prior to the procedure. Using mammographic guidance, sterile technique with chlorhexidine as skin antisepsis, % Nesacaine as local anesthetic, an I-125 radioactive seed was used to localize the [facility] spiral shaped tissue marking clip associated with a complex fibroadenoma in the outer RIGHT breast using a lateral approach. The follow-up mammogram images confirm that the seed is appropriately positioned immediately adjacent to the spiral clip, located approximately 4 mm posterior to the clip. The images are marked for Dr. Dwain Sarna. Follow-up survey of the patient confirms the presence of the radioactive seed. Order number of I-125 seed: [id number] Total activity: 0.259 mCi Reference Date: 05/21/2023 The patient tolerated the procedure well without apparent immediate complications.  She was released from the Breast Center with instructions regarding seed removal. IMPRESSION: Radioactive seed localization of the RIGHT breast. Electronically Signed   By: Hulan Saas M.D.   On: 06/03/2023 11:34   MM LT RADIOACTIVE SEED LOC MAMMO GUIDE  Result Date: 06/02/2023 CLINICAL DATA:  Patient presents for radioactive seed localization of an area of DCIS in the left breast prior to surgical excision. EXAM: MAMMOGRAPHIC GUIDED RADIOACTIVE SEED LOCALIZATION OF THE LEFT BREAST COMPARISON:  Previous exam(s). FINDINGS: Patient presents for radioactive seed localization prior to surgical excision. I met with the patient and we discussed the procedure of seed localization including benefits and alternatives. We discussed the high likelihood of a successful procedure. We discussed the risks of the procedure including infection, bleeding, tissue injury and further surgery. We discussed the low dose  of radioactivity involved in the procedure. Informed, written consent was given. The usual time-out protocol was performed immediately prior to the procedure. Using mammographic guidance, sterile technique, 1% lidocaine and an I-125 radioactive seed, the coil post biopsy marker clip was localized using a lateral approach. The follow-up mammogram images confirm the seed in the expected location and were marked for Dr. Dwain Sarna. Follow-up survey of the patient confirms presence of the radioactive seed. Order number of I-125 seed:  [id number]. Total activity:  0.259 millicuries.  Reference Date: 05/21/2023. The patient tolerated the procedure well and was released from the Breast Center. She was given instructions regarding seed removal. IMPRESSION: Radioactive seed localization left breast. No apparent complications. Electronically Signed   By: Amie Portland M.D.   On: 06/02/2023 13:59       IMPRESSION/PLAN: 1.         Intermediate grade, ER/PR positive DCIS of the left breast. Dr. Mitzi Hansen discusses the final  pathology findings and reviews the nature of noninvasive left breast disease.  We discussed her course and workup to date since her last visit including for multiple biopsy and final pathology results.  Dr. Mitzi Hansen does recommend external radiotherapy to the breast  to reduce risks of local recurrence followed by antiestrogen therapy. We discussed the risks, benefits, short, and long term effects of radiotherapy, as well as the curative intent, and the patient is interested in proceeding. Dr. Mitzi Hansen discusses the delivery and logistics of radiotherapy and anticipates a course of 6 1/2 weeks of radiotherapy to the left breast with deep inspiration breath hold technique, though she is aware of the possibility that insurance may only cover a hypofractionacted course, though with her young age, Dr. Mitzi Hansen prefers the 6 1/2 week regimen with decades of outcome data for a patient who should anticipate multiple decades of life ahead. We will see her back a few weeks after surgery to discuss the simulation process and anticipate we starting radiotherapy about 4-6 weeks after surgery.  2. Contraceptive Counseling. The patient has an IUD and is aware of the need for a negative home pregnancy test prior to radiation simulation and treatment. She is counseled on the use of condoms with her partner during therapy as well.     In a visit lasting *** minutes, greater than 50% of the time was spent face to face reviewing her case, as well as in preparation of, discussing, and coordinating the patient's care.  The above documentation reflects my direct findings during this shared patient visit. Please see the separate note by Dr. Mitzi Hansen on this date for the remainder of the patient's plan of care.    Osker Mason, [facility]    **Disclaimer: This note was dictated with voice recognition software. Similar sounding words can inadvertently be transcribed and this note may contain transcription errors which may not have been  corrected upon publication of note.**

## 2023-07-01 ENCOUNTER — Ambulatory Visit
Admission: RE | Admit: 2023-07-01 | Discharge: 2023-07-01 | Disposition: A | Discharge status: Home / Self Care | Payer: Medicaid Other | Source: Ambulatory Visit | Attending: Radiation Oncology | Admitting: Radiation Oncology

## 2023-07-01 ENCOUNTER — Encounter: Payer: Self-pay | Admitting: Radiation Oncology

## 2023-07-01 ENCOUNTER — Ambulatory Visit: Payer: Medicaid Other

## 2023-07-01 VITALS — BP 131/82 | HR 109 | Temp 98.3°F | Resp 18 | Ht 63.0 in | Wt 177.0 lb

## 2023-07-01 DIAGNOSIS — D0512 Intraductal carcinoma in situ of left breast: Secondary | ICD-10-CM | POA: Insufficient documentation

## 2023-07-01 DIAGNOSIS — Z803 Family history of malignant neoplasm of breast: Secondary | ICD-10-CM | POA: Insufficient documentation

## 2023-07-01 DIAGNOSIS — Z800 Family history of malignant neoplasm of digestive organs: Secondary | ICD-10-CM | POA: Insufficient documentation

## 2023-07-01 DIAGNOSIS — Z8051 Family history of malignant neoplasm of kidney: Secondary | ICD-10-CM | POA: Diagnosis not present

## 2023-07-01 DIAGNOSIS — Z170 Estrogen receptor positive status [ER+]: Secondary | ICD-10-CM | POA: Diagnosis not present

## 2023-07-01 DIAGNOSIS — Z510 Encounter for antineoplastic radiation therapy: Secondary | ICD-10-CM | POA: Diagnosis not present

## 2023-07-01 DIAGNOSIS — Z87891 Personal history of nicotine dependence: Secondary | ICD-10-CM | POA: Diagnosis not present

## 2023-07-01 LAB — PREGNANCY, URINE: Preg Test, Ur: NEGATIVE

## 2023-07-01 NOTE — Progress Notes (Addendum)
Nursing interview for Ductal carcinoma in situ (DCIS) of left breast. Patient identity verified x2.  Patient reports tenderness at the incision line 5/10. No other issues conveyed at this time.  Meaningful use complete. LMP- 06/04/2023 In-house HCG Test on 07/01/2023- NEGATIVE  Vitals- BP 131/82 (BP Location: Right Arm, Patient Position: Sitting, Cuff Size: Normal)   Pulse (!) 109   Temp 98.3 F (36.8 C) (Oral)   Resp 18   Ht 5\' 3"  (1.6 m)   Wt 177 lb (80.3 kg)   LMP 06/04/2023 (Approximate)   SpO2 97%   BMI 31.35 kg/m    This concludes the interaction.  Ruel Favors, LPN

## 2023-07-05 DIAGNOSIS — Z170 Estrogen receptor positive status [ER+]: Secondary | ICD-10-CM | POA: Diagnosis not present

## 2023-07-05 DIAGNOSIS — D0512 Intraductal carcinoma in situ of left breast: Secondary | ICD-10-CM | POA: Diagnosis not present

## 2023-07-05 DIAGNOSIS — Z510 Encounter for antineoplastic radiation therapy: Secondary | ICD-10-CM | POA: Diagnosis not present

## 2023-07-07 ENCOUNTER — Encounter: Payer: Self-pay | Admitting: *Deleted

## 2023-07-07 DIAGNOSIS — D0512 Intraductal carcinoma in situ of left breast: Secondary | ICD-10-CM

## 2023-07-13 ENCOUNTER — Ambulatory Visit: Payer: Medicaid Other | Admitting: Radiation Oncology

## 2023-07-14 ENCOUNTER — Telehealth: Payer: Self-pay | Admitting: Hematology

## 2023-07-14 ENCOUNTER — Ambulatory Visit: Payer: Medicaid Other

## 2023-07-15 ENCOUNTER — Ambulatory Visit: Payer: Medicaid Other

## 2023-07-16 ENCOUNTER — Ambulatory Visit: Payer: Medicaid Other

## 2023-07-17 ENCOUNTER — Ambulatory Visit: Payer: Medicaid Other

## 2023-07-20 ENCOUNTER — Ambulatory Visit
Admission: RE | Admit: 2023-07-20 | Discharge: 2023-07-20 | Disposition: A | Discharge status: Home / Self Care | Payer: Medicaid Other | Source: Ambulatory Visit | Attending: Radiation Oncology | Admitting: Radiation Oncology

## 2023-07-20 ENCOUNTER — Other Ambulatory Visit: Payer: Self-pay

## 2023-07-20 DIAGNOSIS — Z170 Estrogen receptor positive status [ER+]: Secondary | ICD-10-CM | POA: Diagnosis not present

## 2023-07-20 DIAGNOSIS — Z510 Encounter for antineoplastic radiation therapy: Secondary | ICD-10-CM | POA: Diagnosis not present

## 2023-07-20 DIAGNOSIS — D0512 Intraductal carcinoma in situ of left breast: Secondary | ICD-10-CM | POA: Diagnosis not present

## 2023-07-20 LAB — RAD ONC ARIA SESSION SUMMARY
Course Elapsed Days: 0
Plan Fractions Treated to Date: 1
Plan Prescribed Dose Per Fraction: 2.66 Gy
Plan Total Fractions Prescribed: 16
Plan Total Prescribed Dose: 42.56 Gy
Reference Point Dosage Given to Date: 2.66 Gy
Reference Point Session Dosage Given: 2.66 Gy
Session Number: 1

## 2023-07-21 ENCOUNTER — Ambulatory Visit
Admission: RE | Admit: 2023-07-21 | Discharge: 2023-07-21 | Disposition: A | Discharge status: Home / Self Care | Payer: Medicaid Other | Source: Ambulatory Visit | Attending: Radiation Oncology | Admitting: Radiation Oncology

## 2023-07-21 ENCOUNTER — Other Ambulatory Visit: Payer: Self-pay

## 2023-07-21 DIAGNOSIS — D0512 Intraductal carcinoma in situ of left breast: Secondary | ICD-10-CM | POA: Diagnosis not present

## 2023-07-21 DIAGNOSIS — Z170 Estrogen receptor positive status [ER+]: Secondary | ICD-10-CM | POA: Diagnosis not present

## 2023-07-21 DIAGNOSIS — Z419 Encounter for procedure for purposes other than remedying health state, unspecified: Secondary | ICD-10-CM | POA: Diagnosis not present

## 2023-07-21 DIAGNOSIS — Z510 Encounter for antineoplastic radiation therapy: Secondary | ICD-10-CM | POA: Diagnosis not present

## 2023-07-21 LAB — RAD ONC ARIA SESSION SUMMARY
Course Elapsed Days: 1
Plan Fractions Treated to Date: 2
Plan Prescribed Dose Per Fraction: 2.66 Gy
Plan Total Fractions Prescribed: 16
Plan Total Prescribed Dose: 42.56 Gy
Reference Point Dosage Given to Date: 5.32 Gy
Reference Point Session Dosage Given: 2.66 Gy
Session Number: 2

## 2023-07-22 ENCOUNTER — Other Ambulatory Visit: Payer: Self-pay

## 2023-07-22 ENCOUNTER — Ambulatory Visit
Admission: RE | Admit: 2023-07-22 | Discharge: 2023-07-22 | Disposition: A | Discharge status: Home / Self Care | Payer: Medicaid Other | Source: Ambulatory Visit | Attending: Radiation Oncology

## 2023-07-22 DIAGNOSIS — Z510 Encounter for antineoplastic radiation therapy: Secondary | ICD-10-CM | POA: Diagnosis not present

## 2023-07-22 DIAGNOSIS — Z170 Estrogen receptor positive status [ER+]: Secondary | ICD-10-CM | POA: Diagnosis not present

## 2023-07-22 DIAGNOSIS — D0512 Intraductal carcinoma in situ of left breast: Secondary | ICD-10-CM | POA: Diagnosis not present

## 2023-07-22 LAB — RAD ONC ARIA SESSION SUMMARY
Course Elapsed Days: 2
Plan Fractions Treated to Date: 3
Plan Prescribed Dose Per Fraction: 2.66 Gy
Plan Total Fractions Prescribed: 16
Plan Total Prescribed Dose: 42.56 Gy
Reference Point Dosage Given to Date: 7.98 Gy
Reference Point Session Dosage Given: 2.66 Gy
Session Number: 3

## 2023-07-23 ENCOUNTER — Ambulatory Visit
Admission: RE | Admit: 2023-07-23 | Discharge: 2023-07-23 | Disposition: A | Discharge status: Home / Self Care | Payer: Medicaid Other | Source: Ambulatory Visit | Attending: Radiation Oncology | Admitting: Radiation Oncology

## 2023-07-23 ENCOUNTER — Other Ambulatory Visit: Payer: Self-pay

## 2023-07-23 DIAGNOSIS — D0512 Intraductal carcinoma in situ of left breast: Secondary | ICD-10-CM | POA: Diagnosis not present

## 2023-07-23 DIAGNOSIS — Z510 Encounter for antineoplastic radiation therapy: Secondary | ICD-10-CM | POA: Diagnosis not present

## 2023-07-23 DIAGNOSIS — Z170 Estrogen receptor positive status [ER+]: Secondary | ICD-10-CM | POA: Diagnosis not present

## 2023-07-23 LAB — RAD ONC ARIA SESSION SUMMARY
Course Elapsed Days: 3
Plan Fractions Treated to Date: 4
Plan Prescribed Dose Per Fraction: 2.66 Gy
Plan Total Fractions Prescribed: 16
Plan Total Prescribed Dose: 42.56 Gy
Reference Point Dosage Given to Date: 10.64 Gy
Reference Point Session Dosage Given: 2.66 Gy
Session Number: 4

## 2023-07-24 ENCOUNTER — Other Ambulatory Visit: Payer: Self-pay

## 2023-07-24 ENCOUNTER — Ambulatory Visit
Admission: RE | Admit: 2023-07-24 | Discharge: 2023-07-24 | Disposition: A | Discharge status: Home / Self Care | Payer: Medicaid Other | Source: Ambulatory Visit | Attending: Radiation Oncology | Admitting: Radiation Oncology

## 2023-07-24 DIAGNOSIS — D0512 Intraductal carcinoma in situ of left breast: Secondary | ICD-10-CM

## 2023-07-24 DIAGNOSIS — Z510 Encounter for antineoplastic radiation therapy: Secondary | ICD-10-CM | POA: Diagnosis not present

## 2023-07-24 DIAGNOSIS — Z170 Estrogen receptor positive status [ER+]: Secondary | ICD-10-CM | POA: Diagnosis not present

## 2023-07-24 LAB — RAD ONC ARIA SESSION SUMMARY
Course Elapsed Days: 4
Plan Fractions Treated to Date: 5
Plan Prescribed Dose Per Fraction: 2.66 Gy
Plan Total Fractions Prescribed: 16
Plan Total Prescribed Dose: 42.56 Gy
Reference Point Dosage Given to Date: 13.3 Gy
Reference Point Session Dosage Given: 2.66 Gy
Session Number: 5

## 2023-07-24 MED ORDER — RADIAPLEXRX EX GEL: Freq: Once | CUTANEOUS | Status: AC

## 2023-07-24 MED ORDER — ALRA NON-METALLIC DEODORANT (RAD-ONC)
1.0000 | Freq: Once | TOPICAL | Status: AC
  Administered 2023-07-24: 1 via TOPICAL

## 2023-07-25 DIAGNOSIS — Z170 Estrogen receptor positive status [ER+]: Secondary | ICD-10-CM | POA: Diagnosis not present

## 2023-07-25 DIAGNOSIS — D0512 Intraductal carcinoma in situ of left breast: Secondary | ICD-10-CM | POA: Diagnosis not present

## 2023-07-27 ENCOUNTER — Other Ambulatory Visit: Payer: Self-pay

## 2023-07-27 ENCOUNTER — Ambulatory Visit
Admission: RE | Admit: 2023-07-27 | Discharge: 2023-07-27 | Disposition: A | Discharge status: Home / Self Care | Payer: Medicaid Other | Source: Ambulatory Visit | Attending: Radiation Oncology

## 2023-07-27 DIAGNOSIS — Z510 Encounter for antineoplastic radiation therapy: Secondary | ICD-10-CM | POA: Diagnosis not present

## 2023-07-27 DIAGNOSIS — Z170 Estrogen receptor positive status [ER+]: Secondary | ICD-10-CM | POA: Diagnosis not present

## 2023-07-27 DIAGNOSIS — D0512 Intraductal carcinoma in situ of left breast: Secondary | ICD-10-CM | POA: Diagnosis not present

## 2023-07-27 LAB — RAD ONC ARIA SESSION SUMMARY
Course Elapsed Days: 7
Plan Fractions Treated to Date: 6
Plan Prescribed Dose Per Fraction: 2.66 Gy
Plan Total Fractions Prescribed: 16
Plan Total Prescribed Dose: 42.56 Gy
Reference Point Dosage Given to Date: 15.96 Gy
Reference Point Session Dosage Given: 2.5041 Gy
Session Number: 6

## 2023-07-28 ENCOUNTER — Other Ambulatory Visit: Payer: Self-pay

## 2023-07-28 ENCOUNTER — Ambulatory Visit
Admission: RE | Admit: 2023-07-28 | Discharge: 2023-07-28 | Disposition: A | Discharge status: Home / Self Care | Payer: Medicaid Other | Source: Ambulatory Visit | Attending: Radiation Oncology | Admitting: Radiation Oncology

## 2023-07-28 DIAGNOSIS — D0512 Intraductal carcinoma in situ of left breast: Secondary | ICD-10-CM | POA: Diagnosis not present

## 2023-07-28 DIAGNOSIS — Z170 Estrogen receptor positive status [ER+]: Secondary | ICD-10-CM | POA: Diagnosis not present

## 2023-07-28 DIAGNOSIS — Z510 Encounter for antineoplastic radiation therapy: Secondary | ICD-10-CM | POA: Diagnosis not present

## 2023-07-28 LAB — RAD ONC ARIA SESSION SUMMARY
Course Elapsed Days: 8
Plan Fractions Treated to Date: 7
Plan Prescribed Dose Per Fraction: 2.66 Gy
Plan Total Fractions Prescribed: 16
Plan Total Prescribed Dose: 42.56 Gy
Reference Point Dosage Given to Date: 18.62 Gy
Reference Point Session Dosage Given: 2.66 Gy
Session Number: 7

## 2023-07-29 ENCOUNTER — Other Ambulatory Visit: Payer: Self-pay

## 2023-07-29 ENCOUNTER — Ambulatory Visit
Admission: RE | Admit: 2023-07-29 | Discharge: 2023-07-29 | Disposition: A | Discharge status: Home / Self Care | Payer: Medicaid Other | Source: Ambulatory Visit | Attending: Radiation Oncology | Admitting: Radiation Oncology

## 2023-07-29 DIAGNOSIS — Z170 Estrogen receptor positive status [ER+]: Secondary | ICD-10-CM | POA: Diagnosis not present

## 2023-07-29 DIAGNOSIS — Z510 Encounter for antineoplastic radiation therapy: Secondary | ICD-10-CM | POA: Diagnosis not present

## 2023-07-29 DIAGNOSIS — D0512 Intraductal carcinoma in situ of left breast: Secondary | ICD-10-CM | POA: Diagnosis not present

## 2023-07-29 LAB — RAD ONC ARIA SESSION SUMMARY
Course Elapsed Days: 9
Plan Fractions Treated to Date: 8
Plan Prescribed Dose Per Fraction: 2.66 Gy
Plan Total Fractions Prescribed: 16
Plan Total Prescribed Dose: 42.56 Gy
Reference Point Dosage Given to Date: 21.28 Gy
Reference Point Session Dosage Given: 2.66 Gy
Session Number: 8

## 2023-07-30 ENCOUNTER — Ambulatory Visit: Payer: Medicaid Other

## 2023-07-31 ENCOUNTER — Ambulatory Visit
Admission: RE | Admit: 2023-07-31 | Discharge: 2023-07-31 | Disposition: A | Discharge status: Home / Self Care | Payer: Medicaid Other | Source: Ambulatory Visit | Attending: Radiation Oncology | Admitting: Radiation Oncology

## 2023-07-31 ENCOUNTER — Ambulatory Visit: Payer: Medicaid Other | Admitting: Radiation Oncology

## 2023-07-31 ENCOUNTER — Other Ambulatory Visit: Payer: Self-pay

## 2023-07-31 DIAGNOSIS — Z510 Encounter for antineoplastic radiation therapy: Secondary | ICD-10-CM | POA: Diagnosis not present

## 2023-07-31 DIAGNOSIS — D0512 Intraductal carcinoma in situ of left breast: Secondary | ICD-10-CM | POA: Diagnosis not present

## 2023-07-31 DIAGNOSIS — Z170 Estrogen receptor positive status [ER+]: Secondary | ICD-10-CM | POA: Diagnosis not present

## 2023-07-31 LAB — RAD ONC ARIA SESSION SUMMARY
Course Elapsed Days: 11
Plan Fractions Treated to Date: 9
Plan Prescribed Dose Per Fraction: 2.66 Gy
Plan Total Fractions Prescribed: 16
Plan Total Prescribed Dose: 42.56 Gy
Reference Point Dosage Given to Date: 23.94 Gy
Reference Point Session Dosage Given: 2.66 Gy
Session Number: 9

## 2023-08-03 ENCOUNTER — Other Ambulatory Visit: Payer: Self-pay

## 2023-08-03 ENCOUNTER — Ambulatory Visit
Admission: RE | Admit: 2023-08-03 | Discharge: 2023-08-03 | Disposition: A | Discharge status: Home / Self Care | Payer: Medicaid Other | Source: Ambulatory Visit | Attending: Radiation Oncology

## 2023-08-03 DIAGNOSIS — Z170 Estrogen receptor positive status [ER+]: Secondary | ICD-10-CM | POA: Diagnosis not present

## 2023-08-03 DIAGNOSIS — D0512 Intraductal carcinoma in situ of left breast: Secondary | ICD-10-CM | POA: Diagnosis not present

## 2023-08-03 DIAGNOSIS — Z510 Encounter for antineoplastic radiation therapy: Secondary | ICD-10-CM | POA: Diagnosis not present

## 2023-08-03 LAB — RAD ONC ARIA SESSION SUMMARY
Course Elapsed Days: 14
Plan Fractions Treated to Date: 10
Plan Prescribed Dose Per Fraction: 2.66 Gy
Plan Total Fractions Prescribed: 16
Plan Total Prescribed Dose: 42.56 Gy
Reference Point Dosage Given to Date: 26.6 Gy
Reference Point Session Dosage Given: 2.66 Gy
Session Number: 10

## 2023-08-04 ENCOUNTER — Ambulatory Visit: Payer: Medicaid Other | Admitting: Radiation Oncology

## 2023-08-04 ENCOUNTER — Ambulatory Visit
Admission: RE | Admit: 2023-08-04 | Discharge: 2023-08-04 | Disposition: A | Discharge status: Home / Self Care | Payer: Medicaid Other | Source: Ambulatory Visit | Attending: Radiation Oncology | Admitting: Radiation Oncology

## 2023-08-04 ENCOUNTER — Other Ambulatory Visit: Payer: Self-pay

## 2023-08-04 ENCOUNTER — Ambulatory Visit: Payer: Medicaid Other

## 2023-08-04 DIAGNOSIS — Z170 Estrogen receptor positive status [ER+]: Secondary | ICD-10-CM | POA: Diagnosis not present

## 2023-08-04 DIAGNOSIS — Z510 Encounter for antineoplastic radiation therapy: Secondary | ICD-10-CM | POA: Diagnosis not present

## 2023-08-04 DIAGNOSIS — D0512 Intraductal carcinoma in situ of left breast: Secondary | ICD-10-CM | POA: Diagnosis not present

## 2023-08-04 LAB — RAD ONC ARIA SESSION SUMMARY
Course Elapsed Days: 15
Plan Fractions Treated to Date: 11
Plan Prescribed Dose Per Fraction: 2.66 Gy
Plan Total Fractions Prescribed: 16
Plan Total Prescribed Dose: 42.56 Gy
Reference Point Dosage Given to Date: 29.26 Gy
Reference Point Session Dosage Given: 2.66 Gy
Session Number: 11

## 2023-08-05 ENCOUNTER — Other Ambulatory Visit: Payer: Self-pay

## 2023-08-05 ENCOUNTER — Inpatient Hospital Stay (HOSPITAL_BASED_OUTPATIENT_CLINIC_OR_DEPARTMENT_OTHER): Payer: Medicaid Other | Admitting: Hematology and Oncology

## 2023-08-05 ENCOUNTER — Encounter: Payer: Self-pay | Admitting: Hematology and Oncology

## 2023-08-05 ENCOUNTER — Ambulatory Visit: Payer: Medicaid Other

## 2023-08-05 ENCOUNTER — Ambulatory Visit
Admission: RE | Admit: 2023-08-05 | Discharge: 2023-08-05 | Disposition: A | Discharge status: Home / Self Care | Payer: Medicaid Other | Source: Ambulatory Visit | Attending: Radiation Oncology | Admitting: Radiation Oncology

## 2023-08-05 VITALS — BP 105/65 | HR 77 | Temp 97.7°F | Resp 18 | Wt 180.3 lb

## 2023-08-05 DIAGNOSIS — Z87891 Personal history of nicotine dependence: Secondary | ICD-10-CM | POA: Insufficient documentation

## 2023-08-05 DIAGNOSIS — Z170 Estrogen receptor positive status [ER+]: Secondary | ICD-10-CM | POA: Diagnosis not present

## 2023-08-05 DIAGNOSIS — Z510 Encounter for antineoplastic radiation therapy: Secondary | ICD-10-CM | POA: Diagnosis not present

## 2023-08-05 DIAGNOSIS — D0512 Intraductal carcinoma in situ of left breast: Secondary | ICD-10-CM | POA: Diagnosis not present

## 2023-08-05 LAB — RAD ONC ARIA SESSION SUMMARY
Course Elapsed Days: 16
Plan Fractions Treated to Date: 12
Plan Prescribed Dose Per Fraction: 2.66 Gy
Plan Total Fractions Prescribed: 16
Plan Total Prescribed Dose: 42.56 Gy
Reference Point Dosage Given to Date: 31.92 Gy
Reference Point Session Dosage Given: 2.66 Gy
Session Number: 12

## 2023-08-05 MED ORDER — TAMOXIFEN CITRATE 20 MG PO TABS
20.0000 mg | ORAL_TABLET | Freq: Every day | ORAL | 3 refills | Status: AC
  Filled 2023-08-05 – 2023-09-02 (×2): qty 90, 90d supply, fill #0

## 2023-08-05 NOTE — Assessment & Plan Note (Signed)
This is a very pleasant 42 year old premenopausal female patient with left breast ER/PR positive DCIS referred to breast [facility] for additional recommendations.  Breast Cancer Post-surgical and radiation treatment with no evidence of residual disease. Discussed the benefits and potential side effects of Tamoxifen as adjuvant therapy. -Start Tamoxifen 20mg  daily in mid-November after radiation therapy ends. -Review potential side effects including hot flashes, vaginal discharge, risk of blood clots, endometrial hyperplasia and endometrial malignancy -Plan for follow-up in late February or early March to assess tolerance to Tamoxifen.  General Health Maintenance / Followup Plans -Ensure mammograms are ordered for regular monitoring. -Annual follow-up alternating with the breast surgeon after initial tolerance check for Tamoxifen.

## 2023-08-05 NOTE — Progress Notes (Signed)
[facility] CONSULT NOTE  Patient Care Team: Claiborne Rigg, NP as PCP - General (Nurse Practitioner) Pershing Proud, RN as Oncology Nurse Navigator Donnelly Angelica, RN as Oncology Nurse Navigator [name], MD as Consulting Physician (Hematology and Oncology) [name], MD as Consulting Physician (General Surgery) Dorothy Puffer, MD as Consulting Physician (Radiation Oncology)  CHIEF COMPLAINTS/PURPOSE OF CONSULTATION:  Newly diagnosed breast cancer  HISTORY OF PRESENTING ILLNESS:  Tami Howard 42 y.o. female is here because of recent diagnosis of left breast DCIS  I reviewed her records extensively and collaborated the history with the patient.  SUMMARY OF ONCOLOGIC HISTORY: Oncology History  Ductal carcinoma in situ (DCIS) of left breast  03/19/2023 Mammogram   Patient had bilateral screening mammogram which showed possible calcifications in the left breast.  Left breast diagnostic mammogram showed extremely dense breasts and recommendation was to consider repeating the mammogram in 6 months versus considering biopsy.  She initially wanted to wait but after discussing with her family decided to pursue biopsy.   04/27/2023 Pathology Results   Left breast needle core biopsy showed DCIS, predominantly cribriform type with focal necrosis and associated calcifications prognostic showed ER 99% positive moderate to strong staining PR 100% positive strong staining   05/04/2023 Initial Diagnosis   Ductal carcinoma in situ (DCIS) of left breast    Genetic Testing   [facility] Multi-Cancer Panel+RNA was Negative. 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 Report date is 05/19/2023.  The Multi-Cancer + RNA Panel offered by [facility] includes sequencing and/or deletion/duplication analysis of the following 70 genes:  AIP*, ALK, APC*, ATM*, AXIN2*, BAP1*, BARD1*, BLM*, BMPR1A*, BRCA1*, BRCA2*, BRIP1*, CDC73*, CDH1*, CDK4,  CDKN1B*, CDKN2A, CHEK2*, CTNNA1*, DICER1*, EPCAM (del/dup only), EGFR, FH*, FLCN*, GREM1 (promoter dup only), HOXB13, KIT, LZTR1, MAX*, MBD4, MEN1*, MET, MITF, MLH1*, MSH2*, MSH3*, MSH6*, MUTYH*, NF1*, NF2*, NTHL1*, PALB2*, PDGFRA, PMS2*, POLD1*, POLE*, POT1*, PRKAR1A*, PTCH1*, PTEN*, RAD51C*, RAD51D*, RB1*, RET, SDHA* (sequencing only), SDHAF2*, SDHB*, SDHC*, SDHD*, SMAD4*, SMARCA4*, SMARCB1*, SMARCE1*, STK11*, SUFU*, TMEM127*, TP53*, TSC1*, TSC2*, VHL*. RNA analysis is performed for * genes.    Discussed the use of AI scribe software for clinical note transcription with the patient, who gave verbal consent to proceed.  The patient, with a history of breast cancer, is currently undergoing radiation therapy, with one week remaining. She reports that the treatment is going well. Post-surgery, it was found that the cancer was likely completely removed during the biopsy. The patient is otherwise healthy and has no other complaints.  MEDICAL HISTORY:  History reviewed. No pertinent past medical history.  SURGICAL HISTORY: Past Surgical History:  Procedure Laterality Date   BREAST BIOPSY Left 04/27/2023   MM LT BREAST BX W LOC DEV 1ST LESION IMAGE BX SPEC STEREO GUIDE 04/27/2023 [facility]   BREAST BIOPSY Right 05/26/2023   [place] RT BREAST BX W LOC DEV 1ST LESION IMG BX SPEC US GUIDE 05/26/2023 [facility]   BREAST BIOPSY Right 05/26/2023   [place] RT BREAST BX W LOC DEV EA ADD LESION IMG BX SPEC US GUIDE 05/26/2023 [facility]   BREAST BIOPSY  06/02/2023   MM LT RADIOACTIVE SEED LOC MAMMO GUIDE 06/02/2023 [facility]   BREAST BIOPSY  06/03/2023   MM RT RADIOACTIVE SEED LOC MAMMO GUIDE 06/03/2023 [facility]   BREAST LUMPECTOMY WITH RADIOACTIVE SEED LOCALIZATION Left 06/03/2023   Procedure: LEFT BREAST SEED GUIDED LUMPECTOMY;  Surgeon: [name], MD;  Location: [facility];  Service: General;  Laterality: Left;   DIAGNOSTIC LAPAROSCOPY WITH REMOVAL OF ECTOPIC  PREGNANCY Left    pt believes on left side, was able to successfully have children after procedure   EXCISION OF BREAST BIOPSY Right 06/03/2023   Procedure: EXCISION OF BREAST BIOPSY;  Surgeon: [name], MD;  Location: [facility];  Service: General;  Laterality: Right;   ROTATOR CUFF REPAIR      SOCIAL HISTORY: Social History   Socioeconomic History   Marital status: Single    Spouse name: Not on file   Number of children: Not on file   Years of education: Not on file   Highest education level: Not on file  Occupational History   Not on file  Tobacco Use   Smoking status: Former    Current packs/day: 0.00    Types: Cigarettes    Start date: 10/1996    Quit date: 10/2022    Years since quitting: 0.7   Smokeless tobacco: Never  Vaping Use   Vaping status: Every Day   Substances: Nicotine, Flavoring  Substance and Sexual Activity   Alcohol use: Yes    Comment: occasional   Drug use: Not Currently    Types: Marijuana    Comment: 8/13   Sexual activity: Yes    Birth control/protection: Implant    Comment: nexpalon  Other Topics Concern   Not on file  Social History Narrative   Not on file   Social Determinants of Health   Financial Resource Strain: Not on file  Food Insecurity: No Food Insecurity (05/07/2023)   Hunger Vital Sign    Worried About Running Out of Food in the Last Year: Never true    Ran Out of Food in the Last Year: Never true  Transportation Needs: No Transportation Needs (05/07/2023)   PRAPARE - Administrator, Civil Service (Medical): No    Lack of Transportation (Non-Medical): No  Physical Activity: Not on file  Stress: Not on file  Social Connections: Not on file  Intimate Partner Violence: Not At Risk (05/07/2023)   Humiliation, Afraid, Rape, and Kick questionnaire    Fear of Current or Ex-Partner: No    Emotionally Abused: No    Physically Abused: No    Sexually Abused: No    FAMILY HISTORY: Family  History  Problem Relation Age of Onset   Kidney cancer Mother 57   Liver cancer Maternal Grandmother    Liver cancer Maternal Grandfather    Breast cancer Paternal Grandmother 3   Breast cancer Other     ALLERGIES:  has No Known Allergies.  MEDICATIONS:  Current Outpatient Medications  Medication Sig Dispense Refill   [START ON 09/03/2023] tamoxifen (NOLVADEX) 20 MG tablet Take 1 tablet (20 mg total) by mouth daily. 90 tablet 3   Multiple Vitamins-Minerals (MULTIPLE VITAMINS/WOMENS PO) Take by mouth.     No current facility-administered medications for this visit.    REVIEW OF SYSTEMS:   Constitutional: Denies fevers, chills or abnormal night sweats Eyes: Denies blurriness of vision, double vision or watery eyes Ears, nose, mouth, throat, and face: Denies mucositis or sore throat Respiratory: Denies cough, dyspnea or wheezes Cardiovascular: Denies palpitation, chest discomfort or lower extremity swelling Gastrointestinal:  Denies nausea, heartburn or change in bowel habits Skin: Denies abnormal skin rashes Lymphatics: Denies new lymphadenopathy or easy bruising Neurological:Denies numbness, tingling or new weaknesses Behavioral/Psych: Mood is stable, no new changes  Breast: Denies any palpable lumps or discharge All other systems were  reviewed with the patient and are negative.  PHYSICAL EXAMINATION: ECOG PERFORMANCE STATUS: 0 - Asymptomatic  Vitals:   08/05/23 1520  BP: 105/65  Pulse: 77  Resp: 18  Temp: 97.7 F (36.5 C)  SpO2: 99%   Filed Weights   08/05/23 1520  Weight: 180 lb 4.8 oz (81.8 kg)    GENERAL:alert, no distress and comfortable  LABORATORY DATA:  I have reviewed the data as listed Lab Results  Component Value Date   WBC 8.0 05/06/2023   HGB 13.0 05/06/2023   HCT 38.5 05/06/2023   MCV 90.0 05/06/2023   PLT 249 05/06/2023   Lab Results  Component Value Date   NA 139 05/06/2023   K 3.7 05/06/2023   CL 108 05/06/2023   CO2 23 05/06/2023     RADIOGRAPHIC STUDIES: I have personally reviewed the radiological reports and agreed with the findings in the report.  ASSESSMENT AND PLAN:  Ductal carcinoma in situ (DCIS) of left breast This is a very pleasant 42 year old premenopausal female patient with left breast ER/PR positive DCIS referred to breast [facility] for additional recommendations.  Breast Cancer Post-surgical and radiation treatment with no evidence of residual disease. Discussed the benefits and potential side effects of Tamoxifen as adjuvant therapy. -Start Tamoxifen 20mg  daily in mid-November after radiation therapy ends. -Review potential side effects including hot flashes, vaginal discharge, risk of blood clots, endometrial hyperplasia and endometrial malignancy -Plan for follow-up in late February or early March to assess tolerance to Tamoxifen.  General Health Maintenance / Followup Plans -Ensure mammograms are ordered for regular monitoring. -Annual follow-up alternating with the breast surgeon after initial tolerance check for Tamoxifen.   All questions were answered. The patient knows to call the clinic with any problems, questions or concerns.    [name], MD 08/05/23

## 2023-08-06 ENCOUNTER — Ambulatory Visit: Payer: Medicaid Other

## 2023-08-06 ENCOUNTER — Other Ambulatory Visit: Payer: Self-pay

## 2023-08-06 ENCOUNTER — Ambulatory Visit
Admission: RE | Admit: 2023-08-06 | Discharge: 2023-08-06 | Disposition: A | Discharge status: Home / Self Care | Payer: Medicaid Other | Source: Ambulatory Visit | Attending: Radiation Oncology | Admitting: Radiation Oncology

## 2023-08-06 DIAGNOSIS — D0512 Intraductal carcinoma in situ of left breast: Secondary | ICD-10-CM | POA: Diagnosis not present

## 2023-08-06 DIAGNOSIS — Z510 Encounter for antineoplastic radiation therapy: Secondary | ICD-10-CM | POA: Diagnosis not present

## 2023-08-06 DIAGNOSIS — Z170 Estrogen receptor positive status [ER+]: Secondary | ICD-10-CM | POA: Diagnosis not present

## 2023-08-06 LAB — RAD ONC ARIA SESSION SUMMARY
Course Elapsed Days: 17
Plan Fractions Treated to Date: 13
Plan Prescribed Dose Per Fraction: 2.66 Gy
Plan Total Fractions Prescribed: 16
Plan Total Prescribed Dose: 42.56 Gy
Reference Point Dosage Given to Date: 34.58 Gy
Reference Point Session Dosage Given: 2.66 Gy
Session Number: 13

## 2023-08-07 ENCOUNTER — Ambulatory Visit
Admission: RE | Admit: 2023-08-07 | Discharge: 2023-08-07 | Disposition: A | Discharge status: Home / Self Care | Payer: Medicaid Other | Source: Ambulatory Visit | Attending: Radiation Oncology

## 2023-08-07 ENCOUNTER — Other Ambulatory Visit: Payer: Self-pay

## 2023-08-07 ENCOUNTER — Ambulatory Visit: Payer: Medicaid Other

## 2023-08-07 DIAGNOSIS — Z170 Estrogen receptor positive status [ER+]: Secondary | ICD-10-CM | POA: Diagnosis not present

## 2023-08-07 DIAGNOSIS — Z510 Encounter for antineoplastic radiation therapy: Secondary | ICD-10-CM | POA: Diagnosis not present

## 2023-08-07 DIAGNOSIS — D0512 Intraductal carcinoma in situ of left breast: Secondary | ICD-10-CM | POA: Diagnosis not present

## 2023-08-07 LAB — RAD ONC ARIA SESSION SUMMARY
Course Elapsed Days: 18
Plan Fractions Treated to Date: 14
Plan Prescribed Dose Per Fraction: 2.66 Gy
Plan Total Fractions Prescribed: 16
Plan Total Prescribed Dose: 42.56 Gy
Reference Point Dosage Given to Date: 37.24 Gy
Reference Point Session Dosage Given: 2.66 Gy
Session Number: 14

## 2023-08-10 ENCOUNTER — Ambulatory Visit: Payer: Medicaid Other

## 2023-08-11 ENCOUNTER — Ambulatory Visit
Admission: RE | Admit: 2023-08-11 | Discharge: 2023-08-11 | Disposition: A | Discharge status: Home / Self Care | Payer: Medicaid Other | Source: Ambulatory Visit | Attending: Radiation Oncology

## 2023-08-11 ENCOUNTER — Ambulatory Visit: Payer: Medicaid Other

## 2023-08-11 ENCOUNTER — Other Ambulatory Visit: Payer: Self-pay

## 2023-08-11 DIAGNOSIS — Z510 Encounter for antineoplastic radiation therapy: Secondary | ICD-10-CM | POA: Diagnosis not present

## 2023-08-11 DIAGNOSIS — Z170 Estrogen receptor positive status [ER+]: Secondary | ICD-10-CM | POA: Diagnosis not present

## 2023-08-11 DIAGNOSIS — D0512 Intraductal carcinoma in situ of left breast: Secondary | ICD-10-CM | POA: Diagnosis not present

## 2023-08-11 LAB — RAD ONC ARIA SESSION SUMMARY
Course Elapsed Days: 22
Plan Fractions Treated to Date: 15
Plan Prescribed Dose Per Fraction: 2.66 Gy
Plan Total Fractions Prescribed: 16
Plan Total Prescribed Dose: 42.56 Gy
Reference Point Dosage Given to Date: 39.9 Gy
Reference Point Session Dosage Given: 2.66 Gy
Session Number: 15

## 2023-08-12 ENCOUNTER — Ambulatory Visit: Payer: Medicaid Other

## 2023-08-12 ENCOUNTER — Other Ambulatory Visit: Payer: Self-pay

## 2023-08-12 ENCOUNTER — Ambulatory Visit
Admission: RE | Admit: 2023-08-12 | Discharge: 2023-08-12 | Disposition: A | Discharge status: Home / Self Care | Payer: Medicaid Other | Source: Ambulatory Visit | Attending: Radiation Oncology | Admitting: Radiation Oncology

## 2023-08-12 DIAGNOSIS — Z170 Estrogen receptor positive status [ER+]: Secondary | ICD-10-CM | POA: Diagnosis not present

## 2023-08-12 DIAGNOSIS — Z510 Encounter for antineoplastic radiation therapy: Secondary | ICD-10-CM | POA: Diagnosis not present

## 2023-08-12 DIAGNOSIS — D0512 Intraductal carcinoma in situ of left breast: Secondary | ICD-10-CM | POA: Diagnosis not present

## 2023-08-12 LAB — RAD ONC ARIA SESSION SUMMARY
Course Elapsed Days: 23
Plan Fractions Treated to Date: 16
Plan Prescribed Dose Per Fraction: 2.66 Gy
Plan Total Fractions Prescribed: 16
Plan Total Prescribed Dose: 42.56 Gy
Reference Point Dosage Given to Date: 42.56 Gy
Reference Point Session Dosage Given: 2.66 Gy
Session Number: 16

## 2023-08-13 ENCOUNTER — Other Ambulatory Visit: Payer: Self-pay

## 2023-08-13 ENCOUNTER — Ambulatory Visit
Admission: RE | Admit: 2023-08-13 | Discharge: 2023-08-13 | Disposition: A | Discharge status: Home / Self Care | Payer: Medicaid Other | Source: Ambulatory Visit | Attending: Radiation Oncology | Admitting: Radiation Oncology

## 2023-08-13 ENCOUNTER — Ambulatory Visit: Payer: Medicaid Other

## 2023-08-13 DIAGNOSIS — Z170 Estrogen receptor positive status [ER+]: Secondary | ICD-10-CM | POA: Diagnosis not present

## 2023-08-13 DIAGNOSIS — D0512 Intraductal carcinoma in situ of left breast: Secondary | ICD-10-CM | POA: Diagnosis not present

## 2023-08-13 LAB — RAD ONC ARIA SESSION SUMMARY
Course Elapsed Days: 24
Plan Fractions Treated to Date: 1
Plan Prescribed Dose Per Fraction: 2 Gy
Plan Total Fractions Prescribed: 4
Plan Total Prescribed Dose: 8 Gy
Reference Point Dosage Given to Date: 2 Gy
Reference Point Session Dosage Given: 2 Gy
Session Number: 17

## 2023-08-14 ENCOUNTER — Ambulatory Visit
Admission: RE | Admit: 2023-08-14 | Discharge: 2023-08-14 | Disposition: A | Discharge status: Home / Self Care | Payer: Medicaid Other | Source: Ambulatory Visit | Attending: Radiation Oncology

## 2023-08-14 ENCOUNTER — Other Ambulatory Visit: Payer: Self-pay

## 2023-08-14 ENCOUNTER — Ambulatory Visit
Admission: RE | Admit: 2023-08-14 | Discharge: 2023-08-14 | Disposition: A | Discharge status: Home / Self Care | Payer: Medicaid Other | Source: Ambulatory Visit | Attending: Radiation Oncology | Admitting: Radiation Oncology

## 2023-08-14 ENCOUNTER — Ambulatory Visit: Payer: Medicaid Other

## 2023-08-14 DIAGNOSIS — D0512 Intraductal carcinoma in situ of left breast: Secondary | ICD-10-CM | POA: Diagnosis not present

## 2023-08-14 LAB — RAD ONC ARIA SESSION SUMMARY
Course Elapsed Days: 25
Plan Fractions Treated to Date: 2
Plan Prescribed Dose Per Fraction: 2 Gy
Plan Total Fractions Prescribed: 4
Plan Total Prescribed Dose: 8 Gy
Reference Point Dosage Given to Date: 4 Gy
Reference Point Session Dosage Given: 2 Gy
Session Number: 18

## 2023-08-17 ENCOUNTER — Other Ambulatory Visit: Payer: Self-pay

## 2023-08-17 ENCOUNTER — Ambulatory Visit
Admission: RE | Admit: 2023-08-17 | Discharge: 2023-08-17 | Disposition: A | Discharge status: Home / Self Care | Payer: Medicaid Other | Source: Ambulatory Visit | Attending: Radiation Oncology | Admitting: Radiation Oncology

## 2023-08-17 ENCOUNTER — Ambulatory Visit: Payer: Medicaid Other

## 2023-08-17 DIAGNOSIS — D0512 Intraductal carcinoma in situ of left breast: Secondary | ICD-10-CM | POA: Diagnosis not present

## 2023-08-17 LAB — RAD ONC ARIA SESSION SUMMARY
Course Elapsed Days: 28
Plan Fractions Treated to Date: 3
Plan Prescribed Dose Per Fraction: 2 Gy
Plan Total Fractions Prescribed: 4
Plan Total Prescribed Dose: 8 Gy
Reference Point Dosage Given to Date: 6 Gy
Reference Point Session Dosage Given: 2 Gy
Session Number: 19

## 2023-08-18 ENCOUNTER — Ambulatory Visit
Admission: RE | Admit: 2023-08-18 | Discharge: 2023-08-18 | Disposition: A | Discharge status: Home / Self Care | Payer: Medicaid Other | Source: Ambulatory Visit | Attending: Radiation Oncology

## 2023-08-18 ENCOUNTER — Other Ambulatory Visit: Payer: Self-pay

## 2023-08-18 DIAGNOSIS — Z510 Encounter for antineoplastic radiation therapy: Secondary | ICD-10-CM | POA: Diagnosis not present

## 2023-08-18 DIAGNOSIS — Z170 Estrogen receptor positive status [ER+]: Secondary | ICD-10-CM | POA: Diagnosis not present

## 2023-08-18 DIAGNOSIS — D0512 Intraductal carcinoma in situ of left breast: Secondary | ICD-10-CM | POA: Diagnosis not present

## 2023-08-18 LAB — RAD ONC ARIA SESSION SUMMARY
Course Elapsed Days: 29
Plan Fractions Treated to Date: 4
Plan Prescribed Dose Per Fraction: 2 Gy
Plan Total Fractions Prescribed: 4
Plan Total Prescribed Dose: 8 Gy
Reference Point Dosage Given to Date: 8 Gy
Reference Point Session Dosage Given: 2 Gy
Session Number: 20

## 2023-08-19 NOTE — Radiation Completion Notes (Addendum)
  Radiation Oncology         [phone] ________________________________  Name: Tami Howard MRN: [id number]  Date of Service: 08/18/2023  DOB: 11-29-1980  End of Treatment Note    Diagnosis: Intermediate grade, ER/PR positive DCIS of the left breast.  Intent: Curative     ==========DELIVERED PLANS==========  First Treatment Date: 2023-07-20 - Last Treatment Date: 2023-08-18   Plan Name: Breast_L_BH Site: Breast, Left Technique: 3D Mode: Photon Dose Per Fraction: 2.66 Gy Prescribed Dose (Delivered / Prescribed): 42.56 Gy / 42.56 Gy Prescribed Fxs (Delivered / Prescribed): 16 / 16   Plan Name: Brst_L_BH_Bst Site: Breast, Left Technique: 3D Mode: Photon Dose Per Fraction: 2 Gy Prescribed Dose (Delivered / Prescribed): 8 Gy / 8 Gy Prescribed Fxs (Delivered / Prescribed): 4 / 4     ==========ON TREATMENT VISIT DATES========== 2023-07-24, 2023-07-31, 2023-08-07, 2023-08-14  See weekly On Treatment Notes in Epic for details. The patient tolerated radiation. She developed fatigue and anticipated skin changes in the treatment field.   The patient will receive a call in about one month from the radiation oncology department. She will continue follow up with Dr. Al Pimple as well.      Osker Mason, PAC

## 2023-08-21 DIAGNOSIS — Z419 Encounter for procedure for purposes other than remedying health state, unspecified: Secondary | ICD-10-CM | POA: Diagnosis not present

## 2023-08-28 ENCOUNTER — Encounter: Payer: Medicaid Other | Admitting: Nurse Practitioner

## 2023-09-02 ENCOUNTER — Other Ambulatory Visit: Payer: Self-pay

## 2023-09-02 ENCOUNTER — Encounter: Payer: Self-pay | Admitting: Nurse Practitioner

## 2023-09-02 ENCOUNTER — Ambulatory Visit: Payer: Medicaid Other | Attending: Nurse Practitioner | Admitting: Nurse Practitioner

## 2023-09-02 VITALS — BP 101/68 | HR 73 | Ht 63.0 in | Wt 184.6 lb

## 2023-09-02 DIAGNOSIS — Z0000 Encounter for general adult medical examination without abnormal findings: Secondary | ICD-10-CM

## 2023-09-02 NOTE — Progress Notes (Signed)
Assessment & Plan:  Tami Howard was seen today for annual exam.  Diagnoses and all orders for this visit:  Encounter for annual physical exam Follow up with oncology as scheduled    Patient has been counseled on age-appropriate routine health concerns for screening and prevention. These are reviewed and up-to-date. Referrals have been placed accordingly. Immunizations are up-to-date or declined.    Subjective:   Chief Complaint  Patient presents with   Annual Exam    Tami Howard 42 y.o. female presents to office today for annual physical exam.   Patient has been counseled on age-appropriate routine health concerns for screening and prevention. These are reviewed and up-to-date. Referrals have been placed accordingly. Immunizations are up-to-date or declined.  MAMMOGRAM: UTD PAP SMEAR:   UTD  She has a past medical history of Anxiety and Depression, left breast DCIS (followed by Oncology)     Review of Systems  Constitutional:  Negative for fever, malaise/fatigue and weight loss.  HENT: Negative.  Negative for nosebleeds.   Eyes: Negative.  Negative for blurred vision, double vision and photophobia.  Respiratory: Negative.  Negative for cough and shortness of breath.   Cardiovascular: Negative.  Negative for chest pain, palpitations and leg swelling.  Gastrointestinal: Negative.  Negative for heartburn, nausea and vomiting.  Genitourinary: Negative.   Musculoskeletal: Negative.  Negative for myalgias.  Skin: Negative.   Neurological: Negative.  Negative for dizziness, focal weakness, seizures and headaches.  Endo/Heme/Allergies: Negative.   Psychiatric/Behavioral: Negative.  Negative for suicidal ideas.     Past Medical History:  Diagnosis Date   Anxiety    Depression     Past Surgical History:  Procedure Laterality Date   BREAST BIOPSY Left 04/27/2023   MM LT BREAST BX W LOC DEV 1ST LESION IMAGE BX SPEC STEREO GUIDE 04/27/2023 [facility]   BREAST  BIOPSY Right 05/26/2023   [place] RT BREAST BX W LOC DEV 1ST LESION IMG BX SPEC US GUIDE 05/26/2023 [facility]   BREAST BIOPSY Right 05/26/2023   [place] RT BREAST BX W LOC DEV EA ADD LESION IMG BX SPEC US GUIDE 05/26/2023 [facility]   BREAST BIOPSY  06/02/2023   MM LT RADIOACTIVE SEED LOC MAMMO GUIDE 06/02/2023 [facility]   BREAST BIOPSY  06/03/2023   MM RT RADIOACTIVE SEED LOC MAMMO GUIDE 06/03/2023 [facility]   BREAST LUMPECTOMY WITH RADIOACTIVE SEED LOCALIZATION Left 06/03/2023   Procedure: LEFT BREAST SEED GUIDED LUMPECTOMY;  Surgeon: [name], MD;  Location: [facility];  Service: General;  Laterality: Left;   DIAGNOSTIC LAPAROSCOPY WITH REMOVAL OF ECTOPIC PREGNANCY Left    pt believes on left side, was able to successfully have children after procedure   EXCISION OF BREAST BIOPSY Right 06/03/2023   Procedure: EXCISION OF BREAST BIOPSY;  Surgeon: [name], MD;  Location: [facility];  Service: General;  Laterality: Right;   ROTATOR CUFF REPAIR      Family History  Problem Relation Age of Onset   Kidney cancer Mother 22   Liver cancer Maternal Grandmother    Liver cancer Maternal Grandfather    Breast cancer Paternal Grandmother 74   Breast cancer Other     Social History Reviewed with no changes to be made today.   Outpatient Medications Prior to Visit  Medication Sig Dispense Refill   Multiple Vitamins-Minerals (MULTIPLE VITAMINS/WOMENS PO) Take by mouth.     [START ON 09/03/2023] tamoxifen (NOLVADEX) 20 MG tablet Take 1 tablet (20 mg total) by mouth  daily. (Patient not taking: Reported on 09/02/2023) 90 tablet 3   No facility-administered medications prior to visit.    No Known Allergies     Objective:    BP 101/68 (BP Location: Left Arm, Patient Position: Sitting, Cuff Size: Normal)   Pulse 73   Ht 5\' 3"  (1.6 m)   Wt 184 lb 9.6 oz (83.7 kg)   LMP 08/05/2023 (Approximate)   SpO2 99%   BMI 32.70  kg/m  Wt Readings from Last 3 Encounters:  09/02/23 184 lb 9.6 oz (83.7 kg)  08/05/23 180 lb 4.8 oz (81.8 kg)  07/01/23 177 lb (80.3 kg)    Physical Exam Constitutional:      Appearance: She is well-developed.  HENT:     Head: Normocephalic and atraumatic.     Right Ear: Hearing, tympanic membrane, ear canal and external ear normal.     Left Ear: Hearing, tympanic membrane, ear canal and external ear normal.     Nose: Nose normal.     Right Turbinates: Not enlarged.     Left Turbinates: Not enlarged.     Mouth/Throat:     Lips: Pink.     Mouth: Mucous membranes are moist.     Dentition: No dental tenderness, gingival swelling, dental abscesses or gum lesions.     Pharynx: No oropharyngeal exudate.  Eyes:     General: No scleral icterus.       Right eye: No discharge.     Extraocular Movements: Extraocular movements intact.     Conjunctiva/sclera: Conjunctivae normal.     Pupils: Pupils are equal, round, and reactive to light.  Neck:     Thyroid: No thyromegaly.     Trachea: No tracheal deviation.  Cardiovascular:     Rate and Rhythm: Normal rate and regular rhythm.     Heart sounds: Normal heart sounds. No murmur heard.    No friction rub.  Pulmonary:     Effort: Pulmonary effort is normal. No accessory muscle usage or respiratory distress.     Breath sounds: Normal breath sounds. No decreased breath sounds, wheezing, rhonchi or rales.  Abdominal:     General: Bowel sounds are normal. There is no distension.     Palpations: Abdomen is soft. There is no mass.     Tenderness: There is no abdominal tenderness. There is no right CVA tenderness, left CVA tenderness, guarding or rebound.     Hernia: No hernia is present.  Musculoskeletal:        General: No tenderness or deformity. Normal range of motion.     Cervical back: Normal range of motion and neck supple.  Lymphadenopathy:     Cervical: No cervical adenopathy.  Skin:    General: Skin is warm and dry.     Findings:  No erythema.  Neurological:     Mental Status: She is alert and oriented to person, place, and time.     Cranial Nerves: No cranial nerve deficit.     Motor: Motor function is intact.     Coordination: Coordination is intact. Coordination normal.     Gait: Gait is intact.     Deep Tendon Reflexes:     Reflex Scores:      Patellar reflexes are 1+ on the right side and 1+ on the left side. Psychiatric:        Attention and Perception: Attention normal.        Mood and Affect: Mood normal.        Speech:  Speech normal.        Behavior: Behavior normal.        Thought Content: Thought content normal.        Judgment: Judgment normal.          Patient has been counseled extensively about nutrition and exercise as well as the importance of adherence with medications and regular follow-up. The patient was given clear instructions to go to ER or return to medical center if symptoms don't improve, worsen or new problems develop. The patient verbalized understanding.   Follow-up: Return if symptoms worsen or fail to improve.   Claiborne Rigg, FNP-BC [facility] [place], [place] [phone]   09/02/2023, 8:18 PM

## 2023-09-20 DIAGNOSIS — Z419 Encounter for procedure for purposes other than remedying health state, unspecified: Secondary | ICD-10-CM | POA: Diagnosis not present

## 2023-09-21 ENCOUNTER — Ambulatory Visit
Admission: RE | Admit: 2023-09-21 | Discharge: 2023-09-21 | Disposition: A | Discharge status: Home / Self Care | Payer: Medicaid Other | Source: Ambulatory Visit | Attending: Adult Health | Admitting: Adult Health

## 2023-09-21 NOTE — Progress Notes (Signed)
  Radiation Oncology         [phone] ________________________________  Name: Tami Howard MRN: [id number]  Date of Service: 09/21/2023  DOB: 1981/09/22  Post Treatment Telephone Note  Diagnosis:   Intermediate grade, ER/PR positive DCIS of the left breast. (as documented in provider EOT note)  The patient was not available for call today. Voicemail left.  The patient was encouraged to avoid sun exposure in the area of prior treatment for up to one year following radiation with either sunscreen or by the style of clothing worn in the sun.  The patient has scheduled follow up with her medical oncologist Dr. Al Pimple for ongoing surveillance, and was encouraged to call if she develops concerns or questions regarding radiation.    Ruel Favors, LPN

## 2023-09-24 DIAGNOSIS — F4320 Adjustment disorder, unspecified: Secondary | ICD-10-CM | POA: Diagnosis not present

## 2023-09-24 DIAGNOSIS — F411 Generalized anxiety disorder: Secondary | ICD-10-CM | POA: Diagnosis not present

## 2023-10-08 DIAGNOSIS — F4320 Adjustment disorder, unspecified: Secondary | ICD-10-CM | POA: Diagnosis not present

## 2023-10-08 DIAGNOSIS — F411 Generalized anxiety disorder: Secondary | ICD-10-CM | POA: Diagnosis not present

## 2023-10-21 DIAGNOSIS — Z419 Encounter for procedure for purposes other than remedying health state, unspecified: Secondary | ICD-10-CM | POA: Diagnosis not present

## 2023-11-21 DIAGNOSIS — Z419 Encounter for procedure for purposes other than remedying health state, unspecified: Secondary | ICD-10-CM | POA: Diagnosis not present

## 2023-12-19 DIAGNOSIS — Z419 Encounter for procedure for purposes other than remedying health state, unspecified: Secondary | ICD-10-CM | POA: Diagnosis not present

## 2023-12-22 ENCOUNTER — Encounter: Payer: Self-pay | Admitting: *Deleted

## 2023-12-30 ENCOUNTER — Inpatient Hospital Stay: Payer: Medicaid Other | Attending: Adult Health | Admitting: Adult Health

## 2024-01-08 ENCOUNTER — Encounter: Payer: Self-pay | Admitting: *Deleted

## 2024-01-30 DIAGNOSIS — Z419 Encounter for procedure for purposes other than remedying health state, unspecified: Secondary | ICD-10-CM | POA: Diagnosis not present

## 2024-02-22 ENCOUNTER — Encounter: Payer: Self-pay | Admitting: *Deleted

## 2024-02-29 DIAGNOSIS — Z419 Encounter for procedure for purposes other than remedying health state, unspecified: Secondary | ICD-10-CM | POA: Diagnosis not present

## 2024-03-31 DIAGNOSIS — Z419 Encounter for procedure for purposes other than remedying health state, unspecified: Secondary | ICD-10-CM | POA: Diagnosis not present

## 2024-04-13 ENCOUNTER — Encounter: Payer: Self-pay | Admitting: *Deleted

## 2024-04-30 DIAGNOSIS — Z419 Encounter for procedure for purposes other than remedying health state, unspecified: Secondary | ICD-10-CM | POA: Diagnosis not present

## 2024-05-03 ENCOUNTER — Encounter: Payer: Self-pay | Admitting: *Deleted

## 2024-05-31 DIAGNOSIS — Z419 Encounter for procedure for purposes other than remedying health state, unspecified: Secondary | ICD-10-CM | POA: Diagnosis not present

## 2024-07-01 DIAGNOSIS — Z419 Encounter for procedure for purposes other than remedying health state, unspecified: Secondary | ICD-10-CM | POA: Diagnosis not present
